# Patient Record
Sex: Female | Born: 1985 | Race: Black or African American | Hispanic: No | Marital: Married | Smoking: Never smoker
Health system: Southern US, Community
[De-identification: ages and names within clinical notes are randomized; demographics above are authoritative.]

## PROBLEM LIST (undated history)

## (undated) DIAGNOSIS — T7840XA Allergy, unspecified, initial encounter: Secondary | ICD-10-CM

## (undated) DIAGNOSIS — B977 Papillomavirus as the cause of diseases classified elsewhere: Secondary | ICD-10-CM

## (undated) DIAGNOSIS — R896 Abnormal cytological findings in specimens from other organs, systems and tissues: Secondary | ICD-10-CM

## (undated) DIAGNOSIS — IMO0002 Reserved for concepts with insufficient information to code with codable children: Secondary | ICD-10-CM

## (undated) DIAGNOSIS — F419 Anxiety disorder, unspecified: Secondary | ICD-10-CM

## (undated) DIAGNOSIS — E119 Type 2 diabetes mellitus without complications: Secondary | ICD-10-CM

## (undated) HISTORY — DX: Abnormal cytological findings in specimens from other organs, systems and tissues: R89.6

## (undated) HISTORY — DX: Allergy, unspecified, initial encounter: T78.40XA

## (undated) HISTORY — DX: Anxiety disorder, unspecified: F41.9

## (undated) HISTORY — DX: Reserved for concepts with insufficient information to code with codable children: IMO0002

## (undated) HISTORY — DX: Papillomavirus as the cause of diseases classified elsewhere: B97.7

---
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
## 2008-03-08 ENCOUNTER — Emergency Department (HOSPITAL_COMMUNITY): Admission: EM | Admit: 2008-03-08 | Discharge: 2008-03-08 | Payer: Self-pay | Admitting: Emergency Medicine

## 2008-03-28 ENCOUNTER — Ambulatory Visit (HOSPITAL_COMMUNITY): Admission: RE | Admit: 2008-03-28 | Discharge: 2008-03-28 | Payer: Self-pay | Admitting: Orthopedic Surgery

## 2008-07-13 ENCOUNTER — Encounter: Payer: Self-pay | Admitting: Gynecology

## 2008-07-13 ENCOUNTER — Ambulatory Visit: Payer: Self-pay | Admitting: Gynecology

## 2008-07-13 ENCOUNTER — Other Ambulatory Visit: Admission: RE | Admit: 2008-07-13 | Discharge: 2008-07-13 | Payer: Self-pay | Admitting: Gynecology

## 2008-09-16 ENCOUNTER — Emergency Department (HOSPITAL_COMMUNITY): Admission: EM | Admit: 2008-09-16 | Discharge: 2008-09-16 | Payer: Self-pay | Admitting: Emergency Medicine

## 2009-07-25 ENCOUNTER — Emergency Department (HOSPITAL_BASED_OUTPATIENT_CLINIC_OR_DEPARTMENT_OTHER): Admission: EM | Admit: 2009-07-25 | Discharge: 2009-07-25 | Payer: Self-pay | Admitting: Emergency Medicine

## 2009-07-25 ENCOUNTER — Ambulatory Visit: Payer: Self-pay | Admitting: Diagnostic Radiology

## 2009-09-03 DIAGNOSIS — IMO0001 Reserved for inherently not codable concepts without codable children: Secondary | ICD-10-CM

## 2009-09-03 DIAGNOSIS — B977 Papillomavirus as the cause of diseases classified elsewhere: Secondary | ICD-10-CM

## 2009-09-03 HISTORY — DX: Papillomavirus as the cause of diseases classified elsewhere: B97.7

## 2009-09-03 HISTORY — DX: Reserved for inherently not codable concepts without codable children: IMO0001

## 2009-09-13 ENCOUNTER — Other Ambulatory Visit: Admission: RE | Admit: 2009-09-13 | Discharge: 2009-09-13 | Payer: Self-pay | Admitting: Gynecology

## 2009-09-13 ENCOUNTER — Ambulatory Visit: Payer: Self-pay | Admitting: Gynecology

## 2009-10-04 DIAGNOSIS — IMO0002 Reserved for concepts with insufficient information to code with codable children: Secondary | ICD-10-CM

## 2009-10-04 HISTORY — DX: Reserved for concepts with insufficient information to code with codable children: IMO0002

## 2009-10-22 ENCOUNTER — Ambulatory Visit: Payer: Self-pay | Admitting: Gynecology

## 2010-07-05 ENCOUNTER — Emergency Department (HOSPITAL_COMMUNITY)
Admission: EM | Admit: 2010-07-05 | Discharge: 2010-07-05 | Payer: Self-pay | Source: Home / Self Care | Admitting: Emergency Medicine

## 2010-09-15 LAB — RAPID STREP SCREEN (MED CTR MEBANE ONLY): Streptococcus, Group A Screen (Direct): NEGATIVE

## 2010-09-23 ENCOUNTER — Other Ambulatory Visit (HOSPITAL_COMMUNITY)
Admission: RE | Admit: 2010-09-23 | Discharge: 2010-09-23 | Disposition: A | Discharge status: Home / Self Care | Payer: 59 | Source: Ambulatory Visit | Attending: Gynecology | Admitting: Gynecology

## 2010-09-23 ENCOUNTER — Other Ambulatory Visit: Payer: Self-pay | Admitting: Gynecology

## 2010-09-23 ENCOUNTER — Encounter (INDEPENDENT_AMBULATORY_CARE_PROVIDER_SITE_OTHER): Payer: 59 | Admitting: Gynecology

## 2010-09-23 DIAGNOSIS — Z124 Encounter for screening for malignant neoplasm of cervix: Secondary | ICD-10-CM | POA: Insufficient documentation

## 2010-09-23 DIAGNOSIS — N912 Amenorrhea, unspecified: Secondary | ICD-10-CM

## 2010-09-23 DIAGNOSIS — Z113 Encounter for screening for infections with a predominantly sexual mode of transmission: Secondary | ICD-10-CM

## 2010-09-23 DIAGNOSIS — Z01419 Encounter for gynecological examination (general) (routine) without abnormal findings: Secondary | ICD-10-CM

## 2010-09-23 DIAGNOSIS — N946 Dysmenorrhea, unspecified: Secondary | ICD-10-CM

## 2010-09-23 DIAGNOSIS — Z833 Family history of diabetes mellitus: Secondary | ICD-10-CM

## 2010-09-26 ENCOUNTER — Ambulatory Visit (INDEPENDENT_AMBULATORY_CARE_PROVIDER_SITE_OTHER): Payer: 59 | Admitting: Gynecology

## 2010-09-26 ENCOUNTER — Other Ambulatory Visit: Payer: 59

## 2010-09-26 DIAGNOSIS — N949 Unspecified condition associated with female genital organs and menstrual cycle: Secondary | ICD-10-CM

## 2010-09-26 DIAGNOSIS — N912 Amenorrhea, unspecified: Secondary | ICD-10-CM

## 2010-09-26 DIAGNOSIS — R141 Gas pain: Secondary | ICD-10-CM

## 2010-09-26 DIAGNOSIS — Z13220 Encounter for screening for lipoid disorders: Secondary | ICD-10-CM

## 2010-09-26 DIAGNOSIS — Z833 Family history of diabetes mellitus: Secondary | ICD-10-CM

## 2010-10-16 LAB — POCT I-STAT, CHEM 8
BUN: 9 mg/dL (ref 6–23)
Calcium, Ion: 1.15 mmol/L (ref 1.12–1.32)
Chloride: 105 mEq/L (ref 96–112)
Creatinine, Ser: 0.8 mg/dL (ref 0.4–1.2)
Glucose, Bld: 105 mg/dL — ABNORMAL HIGH (ref 70–99)
HCT: 42 % (ref 36.0–46.0)
Hemoglobin: 14.3 g/dL (ref 12.0–15.0)
Potassium: 3.9 mEq/L (ref 3.5–5.1)
Sodium: 139 mEq/L (ref 135–145)
TCO2: 27 mmol/L (ref 0–100)

## 2010-10-20 ENCOUNTER — Other Ambulatory Visit: Payer: 59

## 2010-11-21 NOTE — Op Note (Signed)
Yolanda Gutierrez, Yolanda Gutierrez             ACCOUNT NO.:  [id number]   MEDICAL RECORD NO.:  [id number]          PATIENT TYPE:  AMB   LOCATION:  DSC                          FACILITY:  [facility]   PHYSICIAN:  Harvie Junior, M.D.   DATE OF BIRTH:  1986-06-03   DATE OF PROCEDURE:  11/12/2004  DATE OF DISCHARGE:  11/12/2004                                 OPERATIVE REPORT   PREOPERATIVE DIAGNOSES:  Anterior cruciate ligament tear, right.   POSTOPERATIVE DIAGNOSES:  Anterior cruciate ligament tear, right.   PROCEDURE:  Anterior cruciate ligament reconstruction with __________ 1/3  patellar tendon autograft.   SURGEON:  Harvie Junior, M.D.   ASSISTANT:  Marshia Ly, P.A.   ANESTHESIA:  General.   BRIEF HISTORY:  This 25 year old female with a long history of having  twisting injury to her knee suffering an anterior cruciate ligament tear.  MRI was obtained which showed this tear with no other meniscal pathology.  She was taken to the operating room for fixation of the same after a  discussion of the treatment options and risks.   DESCRIPTION OF PROCEDURE:  The patient was taken to the operating room and  after an adequate level of anesthesia was obtained with general anesthetic,  the patient was placed on the OR table, her right leg was prepped and draped  in the usual sterile fashion. Following this, routine arthroscopic  examination of the knee revealed there was an obvious anterior cruciate  ligament tear. Preoperative exam under anesthesia showed that she had a  positive pivot shift and positive lock. At this point, the remainder of the  knee examination was performed, the medial and lateral meniscus were intact.  The chondral surfaces were intact both medial and laterally. At this point,  attention was turned to the notch where a notchplasty was performed with a  combination of motorized bur and suction shaver. Once the notchplasty had  been performed attention was turned towards  exsanguinating the knee and  tourniquet inflated to 350 mmHg. Following this, a midline incision was made  from the patellar tendon distally. The central one-third of the patellar  tendon was harvested with bone plugs proximally and distally and this was  taken to the back table where Gus Puma, fashioned this to fit in the  knee. This time attention was turned back into the knee where the Arthrex  instruments were used. A 10 mm hole was drilled into the foot print of the  old ACL 6 mm over the top. A guide was then used to find the back wall and  the femoral hole was drilled at this point. Following this, a notch was made  for the femoral screw. All excess bone fragments were milked out of the knee  at this point and the graft which had been fashioned on the back table by  Gus Puma was passed through the knee through the needle that had been  passed into the knee. The graft was then locked in place with a 7 x 20 mm  screw proximally and distally. The knee was put through a range of motion  and isometry was excellent. No tendency towards __________. At this point,  the knee was examined and was Lachman negative, pivot negative. The knee was  copiously irrigated and suctioned dry. The defect in the patellar tendon was  closed with one Ethibond interrupted. The subcu was closed  with #0 and 2-0 Vicryl and the skin with 3-0 Maxon pullout suture. Benzoin  and Steri-Strips applied as well as a sterile compressive dressing with easy  wrap. The patient was taken to the recovery room where she was noted to be  in satisfactory condition.       JLG/MEDQ  D:  12/03/2004  T:  12/03/2004  Job:  [id number]

## 2010-11-21 NOTE — Op Note (Signed)
Yolanda Gutierrez, MCEUEN             ACCOUNT NO.:  [id number]   MEDICAL RECORD NO.:  [id number]          PATIENT TYPE:  AMB   LOCATION:  DSC                          FACILITY:  [facility]   PHYSICIAN:  Harvie Junior, M.D.   DATE OF BIRTH:  05-25-1986   DATE OF PROCEDURE:  11/12/2004  DATE OF DISCHARGE:                                 OPERATIVE REPORT   PREOPERATIVE DIAGNOSIS:  Anterior cruciate ligament tear.   POSTOPERATIVE DIAGNOSIS:  Anterior cruciate ligament tear.   PRINCIPAL PROCEDURE:  Anterior cruciate ligament reconstruction with central  one-third patellar tendon.   SURGEON:  Harvie Junior, M.D.   ASSISTANT:  Marshia Ly, P.A.   ANESTHESIA:  General.   BRIEF HISTORY:  She is a 25 year old female with a long history of having a  twisting type injury to her knee.  She ultimately suffered an ACL tear which  was confirmed by MRI.  No other significant abnormality was noted in the  knee by MRI.  At this point, the patient had a long discussion about  treatment options and ultimately elected to undergo ACL reconstruction.  She  is brought to the operating room for this procedure.   PROCEDURE:  The patient was brought to the operating room.  After adequate  anesthesia was obtained  with general anesthesia, the patient was placed on  the operating table.  The right leg was prepped and draped in the usual  sterile fashion.  Following this, routine arthroscopic examination of the  right knee revealed that there was an obvious tear of the anterior cruciate  ligament with a cyclops balled-up bundle.  The medial meniscus was within  normal limits.  Medial femoral condyle within normal limits.  Lateral side  and lateral femoral condyle within normal limits.  Patellofemoral joint  within normal limits.  Attention was then turned to the old ACL, which was  debrided.  The stump was debrided, and following this the notchplasty was  performed, although a significant notchplasty was not  needed in this case.  Following this, attention was turned toward exsanguination of the extremity,  and a blood pressure __________ related to 300 mmHg.  Following, this, a  small incision was made from the inferior pole of the patella distally.  Subcutaneous tissue was taken down to the level of the paratenon, which was  carefully divided.  The central one-third of the patellar tendon was then  harvested with bone plugs proximally and distally.  At this point, the graft  was taken to the back table and fashioned by Marshia Ly, graftologist.  At this point, attention was turned toward the knee, where the  femoral/tibial guide was set in the old stump of the ACL.  A guide wire was  advanced over this area.  This was overreamed to 10 mm.  Following this, an  over-the-top guide was used to establish a point 6.5 mm anterior to the  distal cortex.  At this point, a 9 mm reamer was used over this guide, and a  femoral hole was then drilled.  Following this, a notcher was used to  establish  a place for a screw.  At this point, all the excess bony fragments  in the knee were removed, and attention was turned toward bringing the graft  into the knee.  It was locked proximally with a 7 x 20 screw.  Excellent  fixation was achieved.  The knee was then cycled through a range of motion.  No tendency toward anisometry.  At this point, the graft was locked in  place, and the leg was put through a range of motion.  Excellent range of  motion was achieved, easy full extension.  Attention was then turned both  medially and laterally at this point to see no remaining bone fragments.  At  this point, the knee was copiously irrigated and suctioned dry.  The central  one-third of the patellar tendon defect was closed with three #2 Ethibond  interrupted sutures.  Paratenon was closed with an 0 Vicryl running suture,  skin with 2-0 Vicryl and 3-0  Maxon pull-up suture.  Benzoin and Steri-Strips were applied.   Sterile  compressive dressing was applied, as well as an EzyWrap knee immobilizer and  the patient was taken to the recovery room and noted to be in satisfactory  condition.  Estimated blood loss for the procedure was none.       JLG/MEDQ  D:  11/12/2004  T:  11/12/2004  Job:  [id number]   cc:   Harvie Junior, M.D.  [address]  [place]  [place] [address]  Fax: [phone]

## 2011-01-12 ENCOUNTER — Emergency Department (HOSPITAL_COMMUNITY): Payer: 59

## 2011-01-12 ENCOUNTER — Emergency Department (HOSPITAL_COMMUNITY)
Admission: EM | Admit: 2011-01-12 | Discharge: 2011-01-12 | Disposition: A | Discharge status: Home / Self Care | Payer: 59 | Attending: Emergency Medicine | Admitting: Emergency Medicine

## 2011-01-12 DIAGNOSIS — H5710 Ocular pain, unspecified eye: Secondary | ICD-10-CM | POA: Insufficient documentation

## 2011-01-12 DIAGNOSIS — B029 Zoster without complications: Secondary | ICD-10-CM | POA: Insufficient documentation

## 2011-01-12 DIAGNOSIS — R51 Headache: Secondary | ICD-10-CM | POA: Insufficient documentation

## 2011-01-12 LAB — DIFFERENTIAL
Basophils Absolute: 0 10*3/uL (ref 0.0–0.1)
Basophils Relative: 0 % (ref 0–1)
Eosinophils Absolute: 0.3 10*3/uL (ref 0.0–0.7)
Eosinophils Relative: 2 % (ref 0–5)
Lymphocytes Relative: 26 % (ref 12–46)
Lymphs Abs: 3.4 10*3/uL (ref 0.7–4.0)
Monocytes Relative: 8 % (ref 3–12)
Neutro Abs: 8.8 10*3/uL — ABNORMAL HIGH (ref 1.7–7.7)

## 2011-01-12 LAB — POCT I-STAT, CHEM 8
Calcium, Ion: 1.11 mmol/L — ABNORMAL LOW (ref 1.12–1.32)
Chloride: 102 mEq/L (ref 96–112)
Glucose, Bld: 108 mg/dL — ABNORMAL HIGH (ref 70–99)
HCT: 40 % (ref 36.0–46.0)
Hemoglobin: 13.6 g/dL (ref 12.0–15.0)

## 2011-01-12 LAB — CBC
HCT: 38.5 % (ref 36.0–46.0)
Hemoglobin: 12.9 g/dL (ref 12.0–15.0)
MCH: 28.5 pg (ref 26.0–34.0)
MCHC: 33.5 g/dL (ref 30.0–36.0)
MCV: 85.2 fL (ref 78.0–100.0)
Platelets: 351 10*3/uL (ref 150–400)
RBC: 4.52 MIL/uL (ref 3.87–5.11)
RDW: 12.3 % (ref 11.5–15.5)
WBC: 13.5 10*3/uL — ABNORMAL HIGH (ref 4.0–10.5)

## 2011-01-12 MED ORDER — IOHEXOL 300 MG/ML  SOLN
100.0000 mL | Freq: Once | INTRAMUSCULAR | Status: AC | PRN
  Administered 2011-01-12: 100 mL via INTRAVENOUS

## 2011-01-13 ENCOUNTER — Emergency Department (HOSPITAL_COMMUNITY)
Admission: EM | Admit: 2011-01-13 | Discharge: 2011-01-14 | Disposition: A | Discharge status: Home / Self Care | Payer: 59 | Attending: Emergency Medicine | Admitting: Emergency Medicine

## 2011-01-13 DIAGNOSIS — R197 Diarrhea, unspecified: Secondary | ICD-10-CM | POA: Insufficient documentation

## 2011-01-13 DIAGNOSIS — R109 Unspecified abdominal pain: Secondary | ICD-10-CM | POA: Insufficient documentation

## 2011-01-13 DIAGNOSIS — R112 Nausea with vomiting, unspecified: Secondary | ICD-10-CM | POA: Insufficient documentation

## 2011-01-13 DIAGNOSIS — R51 Headache: Secondary | ICD-10-CM | POA: Insufficient documentation

## 2011-01-14 LAB — COMPREHENSIVE METABOLIC PANEL
ALT: 18 U/L (ref 0–35)
AST: 18 U/L (ref 0–37)
Albumin: 4.1 g/dL (ref 3.5–5.2)
Alkaline Phosphatase: 94 U/L (ref 39–117)
BUN: 9 mg/dL (ref 6–23)
CO2: 25 mEq/L (ref 19–32)
Calcium: 9.8 mg/dL (ref 8.4–10.5)
Chloride: 100 mEq/L (ref 96–112)
GFR calc Af Amer: >60 mL/min (ref >60)
GFR calc non Af Amer: >60 mL/min (ref >60)
Glucose, Bld: 154 mg/dL — ABNORMAL HIGH (ref 70–99)
Potassium: 3.9 mEq/L (ref 3.5–5.1)
Sodium: 135 mEq/L (ref 135–145)
Total Bilirubin: 0.2 mg/dL — ABNORMAL LOW (ref 0.3–1.2)
Total Protein: 8 g/dL (ref 6.0–8.3)

## 2011-01-14 LAB — CBC
HCT: 40.5 % (ref 36.0–46.0)
Hemoglobin: 13.6 g/dL (ref 12.0–15.0)
MCH: 28.8 pg (ref 26.0–34.0)
MCV: 85.8 fL (ref 78.0–100.0)
Platelets: 367 10*3/uL (ref 150–400)
RDW: 12.3 % (ref 11.5–15.5)
WBC: 13.7 10*3/uL — ABNORMAL HIGH (ref 4.0–10.5)

## 2011-01-14 LAB — DIFFERENTIAL
Basophils Absolute: 0 10*3/uL (ref 0.0–0.1)
Basophils Relative: 0 % (ref 0–1)
Eosinophils Absolute: 0.1 10*3/uL (ref 0.0–0.7)
Eosinophils Relative: 1 % (ref 0–5)
Lymphocytes Relative: 21 % (ref 12–46)
Lymphs Abs: 2.8 10*3/uL (ref 0.7–4.0)
Monocytes Absolute: 0.7 10*3/uL (ref 0.1–1.0)
Neutro Abs: 10 10*3/uL — ABNORMAL HIGH (ref 1.7–7.7)
Neutrophils Relative %: 73 % (ref 43–77)

## 2011-01-19 ENCOUNTER — Ambulatory Visit (INDEPENDENT_AMBULATORY_CARE_PROVIDER_SITE_OTHER): Payer: 59 | Admitting: Gynecology

## 2011-01-19 DIAGNOSIS — Z833 Family history of diabetes mellitus: Secondary | ICD-10-CM

## 2011-01-19 DIAGNOSIS — Z113 Encounter for screening for infections with a predominantly sexual mode of transmission: Secondary | ICD-10-CM

## 2011-01-19 DIAGNOSIS — N926 Irregular menstruation, unspecified: Secondary | ICD-10-CM

## 2011-01-19 DIAGNOSIS — N898 Other specified noninflammatory disorders of vagina: Secondary | ICD-10-CM

## 2011-01-19 DIAGNOSIS — B3731 Acute candidiasis of vulva and vagina: Secondary | ICD-10-CM

## 2011-01-19 DIAGNOSIS — R1031 Right lower quadrant pain: Secondary | ICD-10-CM

## 2011-01-19 DIAGNOSIS — R1032 Left lower quadrant pain: Secondary | ICD-10-CM

## 2011-01-19 DIAGNOSIS — B373 Candidiasis of vulva and vagina: Secondary | ICD-10-CM

## 2011-01-26 ENCOUNTER — Telehealth: Payer: Self-pay | Admitting: *Deleted

## 2011-01-26 NOTE — Telephone Encounter (Signed)
PT CALLED Friday 01/23/11 RE:LAB RESULT,CALLED PT BACK & LM.

## 2011-01-26 NOTE — Telephone Encounter (Signed)
PT INFORMED WITH THE LAST NOTE RE:GI APPT. APPT. SET FOR PT ON 01/29/11 AT 10:15AM WITH DR. Randa Evens. RECENT LAB AND NOTES FAXED TO [facility] [phone]. PER REQUEST.

## 2011-01-26 NOTE — Telephone Encounter (Signed)
PT CALLED Friday 01/23/11 RE:LAB RESULTS. PT CALLED BACK LAB RESULTS GIVEN. PT CALLED TODAY WITH SAME ISSUES AS NOTE ON 01/19/11(SEE CHART) PLEASE ADVISE. STOMACH PAIN & PELVIC PAIN ONLY. JW

## 2011-01-26 NOTE — Telephone Encounter (Signed)
Based on my prior exam I feel that she has a GI component to her pain and I think we need to have her see a gastroenterologist. Set up an appointment with a [facility] or [facility] whoever can see her the soonest and will let them take a look at her.

## 2011-01-30 ENCOUNTER — Telehealth: Payer: Self-pay | Admitting: *Deleted

## 2011-01-30 NOTE — Telephone Encounter (Signed)
PT CALLED STATING HER PERIOD HAD NO CAME YET. TAKING PROVERA 10 MG BID X 5DAY. PT INSTRUCTED TO WAIT 14 DAYS IF NO PERIOD YET THEN CALL OFFICE AGAIN.

## 2011-03-06 ENCOUNTER — Encounter: Payer: Self-pay | Admitting: *Deleted

## 2011-03-06 ENCOUNTER — Encounter: Payer: Self-pay | Admitting: Gynecology

## 2011-03-06 ENCOUNTER — Ambulatory Visit (INDEPENDENT_AMBULATORY_CARE_PROVIDER_SITE_OTHER): Payer: 59 | Admitting: Gynecology

## 2011-03-06 DIAGNOSIS — B977 Papillomavirus as the cause of diseases classified elsewhere: Secondary | ICD-10-CM | POA: Insufficient documentation

## 2011-03-06 DIAGNOSIS — N926 Irregular menstruation, unspecified: Secondary | ICD-10-CM

## 2011-03-06 DIAGNOSIS — IMO0001 Reserved for inherently not codable concepts without codable children: Secondary | ICD-10-CM | POA: Insufficient documentation

## 2011-03-06 DIAGNOSIS — N644 Mastodynia: Secondary | ICD-10-CM

## 2011-03-06 LAB — POCT URINE PREGNANCY: Preg Test, Ur: NEGATIVE

## 2011-03-06 NOTE — Progress Notes (Signed)
Patient presents complaining of tender swollen breasts bilaterally. Patient notes of the last 2 weeks her breasts have progressively gotten more tender. She just started on Sprintec oral contraceptives this past month after progesterone withdrawal. She does admit to not taking the pills regularly initially had some BRCA2 bleeding over the months. She has no nipple discharge fevers chills or other symptoms.  Exam Breasts: Both breasts examined lying and sitting and without masses retractions discharge adenopathy.  Assessment and plan: #1 Mastodynia. I suspect this is related to the pill she is just going to her menses now and is to restart a pack Sunday. Options for management to include continuing on the pack overtaken more consistently we'll see how she does versus switching to a different pill such as a Lo Estrin 120 equivalent. My preference would be for her to stay on the same pill and then we'll see how she does. I did order a prolactin and a UPT for completeness. Patient agrees with the plan. She will let me know how she's doing in a month after she's into the second pac.

## 2012-03-03 ENCOUNTER — Ambulatory Visit (INDEPENDENT_AMBULATORY_CARE_PROVIDER_SITE_OTHER): Payer: 59 | Admitting: Gynecology

## 2012-03-03 ENCOUNTER — Encounter: Payer: Self-pay | Admitting: Gynecology

## 2012-03-03 DIAGNOSIS — R87612 Low grade squamous intraepithelial lesion on cytologic smear of cervix (LGSIL): Secondary | ICD-10-CM

## 2012-03-03 DIAGNOSIS — E282 Polycystic ovarian syndrome: Secondary | ICD-10-CM

## 2012-03-03 DIAGNOSIS — N938 Other specified abnormal uterine and vaginal bleeding: Secondary | ICD-10-CM

## 2012-03-03 DIAGNOSIS — N949 Unspecified condition associated with female genital organs and menstrual cycle: Secondary | ICD-10-CM

## 2012-03-03 DIAGNOSIS — N912 Amenorrhea, unspecified: Secondary | ICD-10-CM

## 2012-03-03 DIAGNOSIS — IMO0002 Reserved for concepts with insufficient information to code with codable children: Secondary | ICD-10-CM

## 2012-03-03 LAB — PREGNANCY, URINE: Preg Test, Ur: NEGATIVE

## 2012-03-03 MED ORDER — MEDROXYPROGESTERONE ACETATE 10 MG PO TABS: 10.0000 mg | ORAL_TABLET | Freq: Every day | ORAL | Status: DC

## 2012-03-03 NOTE — Patient Instructions (Signed)
Take provera pill daily for 10 days.  Follow up in one month for annual exam.

## 2012-03-03 NOTE — Progress Notes (Signed)
Patient presents complaining of brown spotting over the past month or so.  History of oligomenorrhea evaluation last year showed normal FSH TSH prolactin. Was withdrawn on Provera and started on oral contraceptives. Had regular menses through February when she stopped her oral contraceptives in anticipation of marriage and pregnancy desire and has not had a period since then. No cramping pain or other symptoms. No recent weight gain skin changes hair changes. Also had ultrasound last year which showed endometrial echo 6.2 mm normal uterus and ovaries with multiple small follicles suggestive of a PCO pattern. She is wanting to become pregnant sometime over the next several months.  Past medical history,surgical history, medications, allergies, family history and social history were all reviewed and documented in the EPIC chart. ROS:  Was performed and pertinent positives and negatives are included in the history.  Exam of Yolanda Gutierrez Asst. Abdomen soft nontender without masses guarding rebound organomegaly. Pelvic external BUS vagina normal with brown staining. Cervix normal with brown mucoid staining. Uterus grossly normal midline mobile nontender. Adnexa without masses or tenderness.  Assessment and plan: 1. Amenorrhea with most recent DUB. History, labs, ultrasound consistent with PCO pattern. Patient taking a pregnancy sometime over the next several months. UPT negative today. We'll withdraw with Provera 10 mg daily x10 days. Patient's overdue for her annual exam I will schedule this in a month and we'll reassess her withdrawal pattern at that time.  Will plan intermittent progesterone withdrawals every 6 weeks if without spontaneous menses. Does not want to start oral contraceptives as they planned pregnancy sometime in the next several months. 2. Pregnancy.  Start multivitamin with folic acid. Discussed oligoovulatory pattern and options.  Recommended trial of Clomid when they're ready to pursue pregnancy.  I reviewed how Clomid works, side effect profile and the risks to include multiple gestation, hyperstimulation syndrome, possible ovarian cancer linkage. We'll further discuss at her annual exam but would tentatively plan on progesterone withdrawal as needed and initiation of Clomid when they're ready to proceed. 3. 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 Colposcopic biopsy showed low-grade SIL. Follow up Pap 2012 was negative. We'll do Pap smear now when she comes for her annual. As long as normal or low-grade them we'll plan expectant management.

## 2012-03-06 ENCOUNTER — Encounter (HOSPITAL_BASED_OUTPATIENT_CLINIC_OR_DEPARTMENT_OTHER): Payer: Self-pay | Admitting: *Deleted

## 2012-03-06 ENCOUNTER — Emergency Department (HOSPITAL_BASED_OUTPATIENT_CLINIC_OR_DEPARTMENT_OTHER)
Admission: EM | Admit: 2012-03-06 | Discharge: 2012-03-06 | Disposition: A | Discharge status: Home / Self Care | Payer: 59 | Attending: Emergency Medicine | Admitting: Emergency Medicine

## 2012-03-06 DIAGNOSIS — K089 Disorder of teeth and supporting structures, unspecified: Secondary | ICD-10-CM | POA: Insufficient documentation

## 2012-03-06 DIAGNOSIS — K0889 Other specified disorders of teeth and supporting structures: Secondary | ICD-10-CM

## 2012-03-06 MED ORDER — OXYCODONE-ACETAMINOPHEN 5-325 MG PO TABS: 1.0000 | ORAL_TABLET | ORAL | Status: AC | PRN

## 2012-03-06 MED ORDER — PENICILLIN V POTASSIUM 500 MG PO TABS: 500.0000 mg | ORAL_TABLET | Freq: Three times a day (TID) | ORAL | Status: AC

## 2012-03-06 MED ORDER — IBUPROFEN 800 MG PO TABS
800.0000 mg | ORAL_TABLET | Freq: Once | ORAL | Status: AC
  Administered 2012-03-06: 800 mg via ORAL
  Filled 2012-03-06: qty 1

## 2012-03-06 MED ORDER — OXYCODONE-ACETAMINOPHEN 5-325 MG PO TABS
2.0000 | ORAL_TABLET | Freq: Once | ORAL | Status: AC
Start: 2012-03-06 — End: 2012-03-06
  Administered 2012-03-06: 2 via ORAL
  Filled 2012-03-06 (×2): qty 2

## 2012-03-06 MED ORDER — PENICILLIN V POTASSIUM 250 MG PO TABS
500.0000 mg | ORAL_TABLET | Freq: Once | ORAL | Status: AC
  Administered 2012-03-06: 500 mg via ORAL
  Filled 2012-03-06: qty 2

## 2012-03-06 NOTE — ED Notes (Signed)
Dental pain x 3 days.  

## 2012-03-06 NOTE — ED Provider Notes (Signed)
History  This chart was scribed for Hilario Quarry, MD by Erskine Emery. This patient was seen in room MH11/MH11 and the patient's care was started at 18:02.   CSN: [id number]  Arrival date & time 03/06/12  1545   First MD Initiated Contact with Patient 03/06/12 1802      Chief Complaint  Patient presents with  . Dental Pain    (Consider location/radiation/quality/duration/timing/severity/associated sxs/prior treatment) The history is provided by the patient. No language interpreter was used.  Yolanda Gutierrez is a 26 y.o. female who presents to the Emergency Department complaining of gradually worsening moderate dental pain and swelling on the right side for the past 3 days. Pt thinks the pain is associated with her right wisdom tooth. She took amoxycilin and pain medication PTA with no relief from symptoms. Pt reports a headache but denies any fever or ear pain. Pt has no other health problems, is on no medications, and has NKDA other than acyclovir.  Past Medical History  Diagnosis Date  . ASCUS (atypical squamous cells of undetermined significance) on Pap smear 09/2009  . 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 Anterior cruciate ligament repair 2007    Family History  Problem Relation Age of Onset  . Diabetes Father   . Hypertension Father   . Colon cancer Maternal Grandfather     History  Substance Use Topics  . Smoking status: Never Smoker   . Smokeless tobacco: Never Used  . Alcohol Use: Yes    OB History    Grav Para Term Preterm Abortions TAB SAB Ect Mult Living   0               Review of Systems  Constitutional: Negative for fever and chills.  HENT: Positive for dental problem. Negative for hearing loss and ear discharge.   Respiratory: Negative for shortness of breath.   Gastrointestinal: Negative for nausea and vomiting.  Neurological: Positive for  headaches. Negative for weakness.  All other systems reviewed and are negative.    Allergies  Acyclovir and related; Nickel; and Tomato  Home Medications   Current Outpatient Rx  Name Route Sig Dispense Refill  . ACETAMINOPHEN-CODEINE #3 300-30 MG PO TABS Oral Take 1 tablet by mouth every 4 (four) hours as needed. For pain.    Marland Kitchen AMOXICILLIN 500 MG PO CAPS Oral Take 500 mg by mouth 3 (three) times daily.    Marland Kitchen MEDROXYPROGESTERONE ACETATE 10 MG PO TABS Oral Take 1 tablet (10 mg total) by mouth daily. 10 tablet 0    BP 114/72  Pulse 75  Temp 98.7 F (37.1 C) (Oral)  Resp 14  Ht 5\' 8"  (1.727 m)  Wt 220 lb (99.791 kg)  BMI 33.45 kg/m2  SpO2 99%  Physical Exam  Nursing note and vitals reviewed. Constitutional: She is oriented to person, place, and time. She appears well-developed and well-nourished. No distress.  HENT:  Head: Normocephalic and atraumatic.       Mouth is all tender and swollen. No focal swelling.  Floor of mouth is not swollen. Back of throat is clear.  Eyes: EOM are normal. Pupils are equal, round, and reactive to light.  Neck: Neck supple. No tracheal deviation present.  Cardiovascular: Normal rate.   Pulmonary/Chest: Effort normal. No respiratory distress.  Abdominal: Soft. She exhibits no distension.  Musculoskeletal: Normal range of motion. She exhibits no edema.  Neurological: She is alert and oriented to person, place, and time.  Skin: Skin is warm and dry.  Psychiatric: She has a normal mood and affect.    ED Course  Procedures (including critical care time) DIAGNOSTIC STUDIES: Oxygen Saturation is 99% on room air, normal by my interpretation.    COORDINATION OF CARE: 18:10--I evaluated the patient and we discussed a treatment plan including pain medication to which the pt agreed. I instructed the pt to follow up with a dentist.    Labs Reviewed - No data to display No results found.   No diagnosis found.    I personally performed the  services described in this documentation, which was scribed in my presence. The recorded information has been reviewed and considered.    Hilario Quarry, MD 03/06/12 [phone]

## 2012-03-26 ENCOUNTER — Emergency Department (HOSPITAL_BASED_OUTPATIENT_CLINIC_OR_DEPARTMENT_OTHER): Payer: 59

## 2012-03-26 ENCOUNTER — Emergency Department (HOSPITAL_BASED_OUTPATIENT_CLINIC_OR_DEPARTMENT_OTHER)
Admission: EM | Admit: 2012-03-26 | Discharge: 2012-03-27 | Disposition: A | Discharge status: Home / Self Care | Payer: 59 | Attending: Emergency Medicine | Admitting: Emergency Medicine

## 2012-03-26 ENCOUNTER — Encounter (HOSPITAL_BASED_OUTPATIENT_CLINIC_OR_DEPARTMENT_OTHER): Payer: Self-pay | Admitting: Emergency Medicine

## 2012-03-26 DIAGNOSIS — M79672 Pain in left foot: Secondary | ICD-10-CM

## 2012-03-26 DIAGNOSIS — M79609 Pain in unspecified limb: Secondary | ICD-10-CM | POA: Insufficient documentation

## 2012-03-26 NOTE — ED Notes (Signed)
Transported to xray 

## 2012-03-26 NOTE — ED Notes (Signed)
Pt sts she began having left foot pain a few days ago, having difficulty walking.

## 2012-03-27 MED ORDER — HYDROCODONE-ACETAMINOPHEN 5-325 MG PO TABS: 1.0000 | ORAL_TABLET | Freq: Four times a day (QID) | ORAL | Status: AC | PRN

## 2012-03-27 MED ORDER — HYDROCODONE-ACETAMINOPHEN 5-325 MG PO TABS
1.0000 | ORAL_TABLET | Freq: Once | ORAL | Status: DC
  Filled 2012-03-27: qty 1

## 2012-03-27 MED ORDER — NAPROXEN SODIUM 220 MG PO TABS: ORAL_TABLET | ORAL | Status: DC

## 2012-03-27 MED ORDER — NAPROXEN 250 MG PO TABS
500.0000 mg | ORAL_TABLET | Freq: Once | ORAL | Status: AC
  Administered 2012-03-27: 500 mg via ORAL
  Filled 2012-03-27 (×2): qty 1

## 2012-03-27 NOTE — ED Provider Notes (Signed)
History     CSN: [id number]  Arrival date & time 03/26/12  2321   First MD Initiated Contact with Patient 03/27/12 0038      Chief Complaint  Patient presents with  . Foot Pain    (Consider location/radiation/quality/duration/timing/severity/associated sxs/prior treatment) HPI Referral black female with several days of pain in her left foot. The pain is located in the mid foot, proximal to the metatarsals. She describes the pain as a deep pain. It is moderate to severe in intensity. It is worse with palpation, movement or ambulation. She has not been using any new shoes recently. She denies acute trauma. She states it is swollen. There is no left ankle pain.  Past Medical History  Diagnosis Date  . ASCUS (atypical squamous cells of undetermined significance) on Pap smear 09/2009  . 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 Anterior cruciate ligament repair 2007    Family History  Problem Relation Age of Onset  . Diabetes Father   . Hypertension Father   . Colon cancer Maternal Grandfather     History  Substance Use Topics  . Smoking status: Never Smoker   . Smokeless tobacco: Never Used  . Alcohol Use: 0.6 oz/week    1 Cans of beer per week    OB History    Grav Para Term Preterm Abortions TAB SAB Ect Mult Living   0               Review of Systems  All other systems reviewed and are negative.    Allergies  Acyclovir and related; Nickel; and Tomato  Home Medications   Current Outpatient Rx  Name Route Sig Dispense Refill  . MEDROXYPROGESTERONE ACETATE 10 MG PO TABS Oral Take 1 tablet (10 mg total) by mouth daily. 10 tablet 0  . ACETAMINOPHEN-CODEINE #3 300-30 MG PO TABS Oral Take 1 tablet by mouth every 4 (four) hours as needed. For pain.    Marland Kitchen AMOXICILLIN 500 MG PO CAPS Oral Take 500 mg by mouth 3 (three) times daily.      LMP 03/12/2012  Physical  Exam General: Well-developed, well-nourished female in no acute distress; appearance consistent with age of record HENT: normocephalic, atraumatic Eyes: Normal appearance Neck: supple Heart: regular rate and rhythm Lungs: Respiratory effort and excursion Abdomen: soft; nondistended Extremities: No deformity; tender over proximal left foot with slight swelling, no erythema or warmth Neurologic: Awake, alert and oriented; motor function intact in all extremities and symmetric; no facial droop Skin: Warm and dry Psychiatric: Normal mood and affect    ED Course  Procedures (including critical care time)    MDM   Nursing notes and vitals signs, including pulse oximetry, reviewed.  Summary of this visit's results, reviewed by myself:   Imaging Studies: Dg Foot Complete Left  03/27/2012  *RADIOLOGY REPORT*  Clinical Data: Foot pain  LEFT FOOT - COMPLETE 3+ VIEW  Comparison: None.  Findings: No fracture or dislocation is seen.  The joint spaces are preserved.  The visualized soft tissues are unremarkable.  IMPRESSION: Normal foot radiographs.   Original Report Authenticated By: Charline Bills, M.D.    Advised patient to wear comfortable shoes with good arch support. Arch supports may be purchased at any drug store or medical supply store. We will treat with anti-inflammatories and refer to Dr. Pearletha Forge should she not improve.  Hanley Seamen, MD 03/27/12 [phone]

## 2012-03-27 NOTE — ED Notes (Signed)
MD @ BS

## 2012-03-27 NOTE — ED Notes (Signed)
Returned from xray

## 2012-03-27 NOTE — ED Notes (Signed)
Pt refused to take the Vicodin. States she does not like to take narcotics.  MD aware. No further orders received.

## 2012-03-28 ENCOUNTER — Ambulatory Visit (INDEPENDENT_AMBULATORY_CARE_PROVIDER_SITE_OTHER): Payer: 59 | Admitting: Family Medicine

## 2012-03-28 ENCOUNTER — Encounter: Payer: Self-pay | Admitting: Family Medicine

## 2012-03-28 VITALS — BP 104/70 | HR 80 | Ht 68.0 in | Wt 220.0 lb

## 2012-03-28 DIAGNOSIS — M79609 Pain in unspecified limb: Secondary | ICD-10-CM

## 2012-03-28 DIAGNOSIS — M79672 Pain in left foot: Secondary | ICD-10-CM

## 2012-03-28 NOTE — Patient Instructions (Addendum)
Your left foot pain and swelling is either due to a stress fracture or acute gout flare. The location of your pain would be consistent with a metatarsal stress fracture that heals well with conservative care. Both of these conditions are treated similarly so imaging is not necessary unless not improving as expected. Wear postop shoe or boot when up and walking around for support. Crutches as needed. Can take shoe/boot off to ice, bathe, when sleeping. Elevate above the level of your heart when possible. Ice area 15 minutes at a time as needed. ACE wrap for compression. Naproxen twice a day (over the counter aleve would be 2 tablets twice a day with food). Pain medication as needed in addition to this. If not improving over next couple weeks call me otherwise follow-up in 1 month.

## 2012-03-28 NOTE — Assessment & Plan Note (Signed)
no known injury and does not work out regularly to suggest stress fracture though has focal 2nd and 3rd metatarsal tenderness.  Has FH gout and this started insidiously so could represent first gouty attack.  Both treated similarly initially and 2nd or 3rd MT stress fractures tend to heal well with conservative care.  Cam walker with ace wrap.  Elevation, icing.  Naproxen twice a day with food for pain, inflammation, swelling.  F/u in 1 month but call in 2 weeks if not improving.  We discussed MRI though likely would not change our initial management - she would like to wait and do conservative care first.

## 2012-03-28 NOTE — Progress Notes (Signed)
Subjective:    Patient ID: Yolanda Gutierrez, female    DOB: 1985-07-12, 26 y.o.   MRN: [id number]  PCP: None listed  HPI Yolanda Gutierrez here for left foot pain.  Patient denies known injury. 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 States while at work Wednesday started to develop left dorsal foot pain. Progressed since that time, including swelling and difficulty bearing weight. Went to ED and had x-rays negative for fracture or other abnormalities. Has been icing, elevation, taking naproxen. No h/o gout or autoimmune disease. Has h/o right foot stress fracture when she was active in sports. Does not run or exercise regularly now.  Past Medical History  Diagnosis Date  . ASCUS (atypical squamous cells of undetermined significance) on Pap smear 09/2009  . 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 LGSIL (low grade squamous intraepithelial dysplasia) 10/2009    Current Outpatient Prescriptions on File Prior to Visit  Medication Sig Dispense Refill  . HYDROcodone-acetaminophen (NORCO/VICODIN) 5-325 MG per tablet Take 1-2 tablets by mouth every 6 (six) hours as needed for pain.  20 tablet  0  . medroxyPROGESTERone (PROVERA) 10 MG tablet Take 1 tablet (10 mg total) by mouth daily.  10 tablet  0  . naproxen sodium (ALEVE) 220 MG tablet Take 2 tablets twice daily as needed for foot pain and swelling.        Past Surgical History  Procedure Date  . Anterior cruciate ligament repair 2007    Allergies  Allergen Reactions  . Acyclovir And Related Nausea And Vomiting  . Nickel   . Tomato     History   Social History  . Marital Status: Single    Spouse Name: N/A    Number of Children: N/A  . Years of Education: N/A   Occupational History  . Not on file.   Social History Main Topics  . Smoking status: Never Smoker   . Smokeless tobacco: Never Used  . Alcohol Use: 0.6 oz/week    1 Cans of beer per week  . Drug Use: No  . Sexually Active: Yes -- Female partner(s)   Other Topics Concern  .  Not on file   Social History Narrative  . No narrative on file    Family History  Problem Relation Age of Onset  . Diabetes Father   . Hypertension Father   . Hyperlipidemia Father   . Colon cancer Maternal Grandfather   . Heart attack Neg Hx   . Sudden death Neg Hx     BP 104/70  Pulse 80  Ht 5\' 8"  (1.727 m)  Wt 220 lb (99.791 kg)  BMI 33.45 kg/m2  LMP 03/12/2012  Review of Systems See HPI above.    Objective:   Physical Exam Gen: NAD  L foot: Mild swelling dorsally.  No bruising, other deformity.  Pes planus. TTP 2nd, 3rd metatarsal mainly - less TTP proximal to this, post to medial and lateral malleoli.  No other TTP about foot/ankle. Mild limitation all motions but 5/5 strength resisted motions without reproduction of pain. Negative thompsons. Negative ant drawer and talar tilt. NVI distally.    Assessment & Plan:  1. Left foot pain - no known injury and does not work out regularly to suggest stress fracture though has focal 2nd and 3rd metatarsal tenderness.  Has FH gout and this started insidiously so could represent first gouty attack.  Both treated similarly initially and 2nd or 3rd MT stress fractures tend to heal well with conservative  care.  Cam walker with ace wrap.  Elevation, icing.  Naproxen twice a day with food for pain, inflammation, swelling.  Gutierrez/u in 1 month but call in 2 weeks if not improving.  We discussed MRI though likely would not change our initial management - she would like to wait and do conservative care first.

## 2012-04-04 ENCOUNTER — Encounter: Payer: 59 | Admitting: Gynecology

## 2012-04-26 ENCOUNTER — Ambulatory Visit (INDEPENDENT_AMBULATORY_CARE_PROVIDER_SITE_OTHER): Payer: 59 | Admitting: Gynecology

## 2012-04-26 ENCOUNTER — Other Ambulatory Visit (HOSPITAL_COMMUNITY)
Admission: RE | Admit: 2012-04-26 | Discharge: 2012-04-26 | Disposition: A | Discharge status: Home / Self Care | Payer: 59 | Source: Ambulatory Visit | Attending: Gynecology | Admitting: Gynecology

## 2012-04-26 ENCOUNTER — Encounter: Payer: Self-pay | Admitting: Gynecology

## 2012-04-26 VITALS — BP 138/72 | Ht 68.0 in | Wt 218.0 lb

## 2012-04-26 DIAGNOSIS — N926 Irregular menstruation, unspecified: Secondary | ICD-10-CM

## 2012-04-26 DIAGNOSIS — Z01419 Encounter for gynecological examination (general) (routine) without abnormal findings: Secondary | ICD-10-CM

## 2012-04-26 MED ORDER — MEDROXYPROGESTERONE ACETATE 10 MG PO TABS: 10.0000 mg | ORAL_TABLET | Freq: Two times a day (BID) | ORAL | Status: DC

## 2012-04-26 NOTE — Patient Instructions (Signed)
Take Provera twice daily for 5 days to bring on. After checking a urine pregnancy test. Call for Clomid prescription.

## 2012-04-26 NOTE — Progress Notes (Signed)
Colby Garant Sep 07, 1985 [id number]        26 y.o.  G0P0 for annual exam.  Several issues noted below.  Past medical history,surgical history, medications, allergies, family history and social history were all reviewed and documented in the EPIC chart. ROS:  Was performed and pertinent positives and negatives are included in the history.  Exam: Fleet Contras assistant Filed Vitals:   04/26/12 1221  BP: 138/72  Height: 5\' 8"  (1.727 m)  Weight: 218 lb (98.884 kg)   General appearance  Normal Skin grossly normal Head/Neck normal with no cervical or supraclavicular adenopathy thyroid normal Lungs  clear Cardiac RR, without RMG Abdominal  soft, nontender, without masses, organomegaly or hernia Breasts  examined lying and sitting without masses, retractions, discharge or axillary adenopathy. Pelvic  Ext/BUS/vagina  normal   Cervix  normal Pap done  Uterus  anteverted, normal size, shape and contour, midline and mobile nontender   Adnexa  Without masses or tenderness    Anus and perineum  normal   Rectovaginal  normal sphincter tone without palpated masses or tenderness.    Assessment/Plan:  26 y.o. G0P0 female for annual exam.   1. History low-grade SIL. Patient had Pap smear showing ASCUS/high-risk HPV 2011. Colposcopic biopsy showed low-grade SIL. Pap smear 2012 normal. Pap done today. If negative plan expected management. Otherwise we'll triage based on results. 2. Oligo-ovulatory pattern.  Took Provera last month with withdrawal bleed. Awaiting menses now with negative UPT yesterday. History of PCO-type pattern. Had negative TSH prolactin FSH a year ago and ultrasound showed ring of pearl type changes. We discussed possible Clomid in August the patient is ready to proceed with this now. I again discussed with involved with Clomid and the risks to include hyperstimulation, multiple gestation and possible ovarian cancer linkage. She understands and accepts these. She'll wait one week and if  without menses withdraw on Provera 10 mg twice a day x5 days. She noticed check a UPT before starting. She will then call me and I'll prescribe Clomid 50 mg days 5 through 9. Follow up instructions were reviewed with her at the end of the cycle and timing as far as intercourse was also discussed.  Need to be on a prenatal vitamin reviewed. 3. Breast health. SBE monthly reviewed. 4. Health maintenance. The blood work done today as she had normal blood work over a year ago and this will all be studied once she achieves pregnancy.    Dara Lords MD, 12:51 PM 04/26/2012

## 2012-05-09 ENCOUNTER — Telehealth: Payer: Self-pay | Admitting: *Deleted

## 2012-05-09 ENCOUNTER — Encounter: Payer: Self-pay | Admitting: Gynecology

## 2012-05-09 NOTE — Telephone Encounter (Signed)
Pt was given provera 10 mg twice daily x 5 days as directed at OV 04/26/12, took UPT negative results. Pt called today stating no cycle yet. Pt was told to follow up if no cycle. Please advise

## 2012-05-09 NOTE — Telephone Encounter (Signed)
Wait one week. If without menses check pregnancy test again. Assuming negative call and we'll withdraw on Provera again with a little stronger dose.  Assuming she does start her period then she'll call and we'll start her on the Clomid as we discussed.

## 2012-05-09 NOTE — Telephone Encounter (Signed)
Pt informed with the below note,she will do as directed below.

## 2012-05-12 ENCOUNTER — Telehealth: Payer: Self-pay | Admitting: *Deleted

## 2012-05-12 MED ORDER — CLOMIPHENE CITRATE 50 MG PO TABS: 50.0000 mg | ORAL_TABLET | Freq: Every day | ORAL | Status: DC

## 2012-05-12 NOTE — Telephone Encounter (Signed)
Pt called and her cycle did start today. Pt said to call when cycle starts to get clomid Rx.

## 2012-05-12 NOTE — Telephone Encounter (Signed)
Clomid 50 mg daily 5 through 9. Patient to call at the end of the cycle if menses start call within the first day or 2. If late for menses wait until day 30-32 and then check a urine pregnancy test.

## 2012-05-13 NOTE — Telephone Encounter (Signed)
Spoke with pt regarding the below this am, and her cycle stopped late last night, no bleeding this am as well. Should pt still take clomid?

## 2012-05-13 NOTE — Telephone Encounter (Signed)
Pt informed with the below note. 

## 2012-05-13 NOTE — Telephone Encounter (Addendum)
Just make sure she checks a pregnancy test. Assuming negative, start Clomid day 5

## 2012-06-13 ENCOUNTER — Encounter: Payer: Self-pay | Admitting: Gynecology

## 2012-06-13 ENCOUNTER — Ambulatory Visit (INDEPENDENT_AMBULATORY_CARE_PROVIDER_SITE_OTHER): Payer: 59 | Admitting: Gynecology

## 2012-06-13 DIAGNOSIS — N920 Excessive and frequent menstruation with regular cycle: Secondary | ICD-10-CM

## 2012-06-13 LAB — CBC WITH DIFFERENTIAL/PLATELET
Eosinophils Absolute: 0.1 10*3/uL (ref 0.0–0.7)
Hemoglobin: 12.5 g/dL (ref 12.0–15.0)
Lymphocytes Relative: 27 % (ref 12–46)
Lymphs Abs: 3.4 10*3/uL (ref 0.7–4.0)
Monocytes Relative: 8 % (ref 3–12)
Neutro Abs: 7.8 10*3/uL — ABNORMAL HIGH (ref 1.7–7.7)
Neutrophils Relative %: 64 % (ref 43–77)
Platelets: 412 10*3/uL — ABNORMAL HIGH (ref 150–400)
RBC: 4.36 MIL/uL (ref 3.87–5.11)
WBC: 12.3 10*3/uL — ABNORMAL HIGH (ref 4.0–10.5)

## 2012-06-13 MED ORDER — IBUPROFEN 800 MG PO TABS: 800.0000 mg | ORAL_TABLET | Freq: Three times a day (TID) | ORAL | Status: DC | PRN

## 2012-06-13 NOTE — Patient Instructions (Signed)
Follow up for ultrasound as scheduled 

## 2012-06-13 NOTE — Progress Notes (Signed)
Patient presents with LMP November 7. She took Clomid 50 mg daily days 5 through 9 began bleeding day 31 with heavy passage of clots lasted 24 hours and now has tapered off. Also a lot of cramping.  Exam was Sherrilyn Rist Asst. Abdomen soft minimal tenderness without masses guarding rebound organomegaly. Pelvic external BUS vagina light menses flow. Cervix closed light menses flow. Uterus grossly normal midline mobile nontender. Adnexa without masses or tenderness  Assessment and plan: Menorrhagia, Clomid cycle.  Discussed with patient may be hormonal secondary to her first ovulatory cycle. Recommend ultrasound rule out ovarian cystic process intracavitary abnormalities. Baseline CBC and hCG to rule out pregnancy related event. Asked her to do this for the next day or 2. If all normal, options to start Clomid days 5 through 9 again this cycle or rest the cycle was reviewed and we will discuss pending lab and ultrasound results.

## 2012-06-14 LAB — HCG, SERUM, QUALITATIVE: Preg, Serum: NEGATIVE

## 2012-06-15 ENCOUNTER — Ambulatory Visit (INDEPENDENT_AMBULATORY_CARE_PROVIDER_SITE_OTHER): Payer: 59

## 2012-06-15 ENCOUNTER — Ambulatory Visit (INDEPENDENT_AMBULATORY_CARE_PROVIDER_SITE_OTHER): Payer: 59 | Admitting: Gynecology

## 2012-06-15 ENCOUNTER — Encounter: Payer: Self-pay | Admitting: Gynecology

## 2012-06-15 DIAGNOSIS — N949 Unspecified condition associated with female genital organs and menstrual cycle: Secondary | ICD-10-CM

## 2012-06-15 DIAGNOSIS — N921 Excessive and frequent menstruation with irregular cycle: Secondary | ICD-10-CM

## 2012-06-15 DIAGNOSIS — N920 Excessive and frequent menstruation with regular cycle: Secondary | ICD-10-CM

## 2012-06-15 DIAGNOSIS — E282 Polycystic ovarian syndrome: Secondary | ICD-10-CM

## 2012-06-15 DIAGNOSIS — R102 Pelvic and perineal pain: Secondary | ICD-10-CM

## 2012-06-15 NOTE — Progress Notes (Signed)
Patient presents for ultrasound. Was on first Clomid and had heavy bleeding with cramping. Her hemoglobin was 12 hCG negative. Ultrasound today shows a normal appearing uterus with endometrial echo 4.3 mm. Right left ovaries visualized with numerous small follicles consistent with PCO pattern no significant cysts. Cul-de-sac negative.  Patient wants to rest of the cycle and plans that we try Clomid 50 mg next cycle. She will call me when her next menses begins or by week 5.

## 2012-06-15 NOTE — Patient Instructions (Signed)
Call me when your next period starts or if you do not start it. Call me 5 weeks into the cycle.

## 2012-08-20 ENCOUNTER — Other Ambulatory Visit: Payer: Self-pay

## 2012-09-20 ENCOUNTER — Encounter: Payer: Self-pay | Admitting: Gynecology

## 2012-11-21 ENCOUNTER — Ambulatory Visit (INDEPENDENT_AMBULATORY_CARE_PROVIDER_SITE_OTHER): Payer: Managed Care, Other (non HMO) | Admitting: Internal Medicine

## 2012-11-21 VITALS — BP 112/64 | HR 78 | Temp 98.3°F | Resp 18 | Ht 67.5 in | Wt 211.0 lb

## 2012-11-21 DIAGNOSIS — R509 Fever, unspecified: Secondary | ICD-10-CM

## 2012-11-21 DIAGNOSIS — J039 Acute tonsillitis, unspecified: Secondary | ICD-10-CM

## 2012-11-21 DIAGNOSIS — R52 Pain, unspecified: Secondary | ICD-10-CM

## 2012-11-21 LAB — POCT CBC
Granulocyte percent: 67.1 %G (ref 37–80)
HCT, POC: 40.5 % (ref 37.7–47.9)
MCH, POC: 28.5 pg (ref 27–31.2)
MCV: 90.9 fL (ref 80–97)
MID (cbc): 0.6 (ref 0–0.9)
POC LYMPH PERCENT: 24.7 %L (ref 10–50)
Platelet Count, POC: 343 10*3/uL (ref 142–424)
RDW, POC: 13.4 %
WBC: 7.3 10*3/uL (ref 4.6–10.2)

## 2012-11-21 LAB — POCT UA - MICROSCOPIC ONLY
Bacteria, U Microscopic: NEGATIVE
Mucus, UA: NEGATIVE
WBC, Ur, HPF, POC: NEGATIVE

## 2012-11-21 LAB — POCT URINALYSIS DIPSTICK
Bilirubin, UA: NEGATIVE
Ketones, UA: NEGATIVE
Leukocytes, UA: NEGATIVE
Spec Grav, UA: 1.015
pH, UA: 7

## 2012-11-21 MED ORDER — HYDROCODONE-HOMATROPINE 5-1.5 MG/5ML PO SYRP: 5.0000 mL | ORAL_SOLUTION | Freq: Four times a day (QID) | ORAL | Status: DC | PRN

## 2012-11-21 NOTE — Progress Notes (Signed)
Subjective:    Patient ID: Yolanda Gutierrez, female    DOB: January 09, 1986, 27 y.o.   MRN: [id number]  HPI complaining of aching all over for 3 days In bones and joints as well as the big muscles Having fever with chills and night sweats Complaining of sore throat and cough which is occasionally productive  has a headache when the fever and elevated/feels woozy but no syncope or vertigo Complaining of urinary frequency but no dysuria and and no vaginal discharge   Just married in May  the third //last menstrual period May 3/using no birth control See hx by cna as well   Review of Systems  no recent weight loss and  No chest pain or palpitations No nausea vomiting or diarrhea No skin rash No peripheral edema No visual disturbances No neck stiffness    Objective:   Physical Exam BP 112/64  Pulse 78  Temp(Src) 98.3 F (36.8 C) (Oral)  Resp 18  Ht 5' 7.5" (1.715 m)  Wt 211 lb (95.709 kg)  BMI 32.54 kg/m2  SpO2 99%  LMP 10/22/2012 TMs clear Nares clear Throat slightly injected with slight exudate   no a.c. Nodes Chest clear to auscultation Abdomen soft nontender nondistended with no organomegaly or masses Extremities without rash or edema  Results for orders placed in visit on 11/21/12  POCT CBC      Result Value Range   WBC 7.3  4.6 - 10.2 K/uL   Lymph, poc 1.8  0.6 - 3.4   POC LYMPH PERCENT 24.7  10 - 50 %L   MID (cbc) 0.6  0 - 0.9   POC MID % 8.2  0 - 12 %M   POC Granulocyte 4.9  2 - 6.9   Granulocyte percent 67.1  37 - 80 %G   RBC 4.46  4.04 - 5.48 M/uL   Hemoglobin 12.7  12.2 - 16.2 g/dL   HCT, POC 95.6  21.3 - 47.9 %   MCV 90.9  80 - 97 fL   MCH, POC 28.5  27 - 31.2 pg   MCHC 31.4 (*) 31.8 - 35.4 g/dL   RDW, POC 08.6     Platelet Count, POC 343  142 - 424 K/uL   MPV 8.0  0 - 99.8 fL  POCT RAPID STREP A (OFFICE)      Result Value Range   Rapid Strep A Screen Negative  Negative  POCT URINALYSIS DIPSTICK      Result Value Range   Color, UA yellow     Clarity, UA clear     Glucose, UA negative     Bilirubin, UA negative     Ketones, UA negative     Spec Grav, UA 1.015     Blood, UA trace-intact     pH, UA 7.0     Protein, UA negative     Urobilinogen, UA 0.2     Nitrite, UA negative     Leukocytes, UA Negative    POCT UA - MICROSCOPIC ONLY      Result Value Range   WBC, Ur, HPF, POC negative     RBC, urine, microscopic 0-1     Bacteria, U Microscopic negative     Mucus, UA negative     Epithelial cells, urine per micros 1-2     Crystals, Ur, HPF, POC negative     Casts, Ur, LPF, POC negative     Yeast, UA negative          Assessment &  Plan:  Body aches - Plan: POCT CBC, POCT rapid strep A, POCT urinalysis dipstick, POCT UA - Microscopic Only, Culture, Group A Strep  Fever, unspecified - Plan: POCT CBC, POCT rapid strep A, POCT urinalysis dipstick, POCT UA - Microscopic Only, Culture, Group A Strep  Acute tonsillitis - Plan: POCT CBC, POCT rapid strep A, POCT urinalysis dipstick, POCT UA - Microscopic Only, Culture, Group A Strep  Cough  I suspect a viral illness with his flulike syndrome/fluids bed rest and analgesics are needed with close followup/OOW 48 hour

## 2012-11-21 NOTE — Progress Notes (Signed)
  Subjective:    Patient ID: Yolanda Gutierrez, female    DOB: 02-14-1986, 27 y.o.   MRN: [id number]  HPI 27 year old female presents with : generalized bodyaches x 3 days chills, sore throat- hurts to swallow, no fever presently, occasional cough- productive, cramping in abdominal, no vaginal discharge, urinary frequency, SOB x yesterday, no diarrhea, no rash  LMP: 3 weeks ago, no BC Recently married 3 weeks ago  Panic attack last night, possibly due to being so sick  history of panic attacks: occur occasionally 3 times a year No medication treatment for panic attacks recently; in high school she took medicine Review of Systems     Objective:   Physical Exam No leg swelling Pus in throat       Assessment & Plan:

## 2012-11-23 ENCOUNTER — Emergency Department (HOSPITAL_BASED_OUTPATIENT_CLINIC_OR_DEPARTMENT_OTHER)
Admission: EM | Admit: 2012-11-23 | Discharge: 2012-11-23 | Disposition: A | Discharge status: Home / Self Care | Payer: Managed Care, Other (non HMO) | Attending: Emergency Medicine | Admitting: Emergency Medicine

## 2012-11-23 ENCOUNTER — Encounter (HOSPITAL_BASED_OUTPATIENT_CLINIC_OR_DEPARTMENT_OTHER): Payer: Self-pay | Admitting: Emergency Medicine

## 2012-11-23 DIAGNOSIS — T50905A Adverse effect of unspecified drugs, medicaments and biological substances, initial encounter: Secondary | ICD-10-CM

## 2012-11-23 DIAGNOSIS — R55 Syncope and collapse: Secondary | ICD-10-CM | POA: Insufficient documentation

## 2012-11-23 DIAGNOSIS — Z8659 Personal history of other mental and behavioral disorders: Secondary | ICD-10-CM | POA: Insufficient documentation

## 2012-11-23 DIAGNOSIS — T4275XA Adverse effect of unspecified antiepileptic and sedative-hypnotic drugs, initial encounter: Secondary | ICD-10-CM | POA: Insufficient documentation

## 2012-11-23 DIAGNOSIS — H9209 Otalgia, unspecified ear: Secondary | ICD-10-CM | POA: Insufficient documentation

## 2012-11-23 DIAGNOSIS — Z3202 Encounter for pregnancy test, result negative: Secondary | ICD-10-CM | POA: Insufficient documentation

## 2012-11-23 DIAGNOSIS — B9789 Other viral agents as the cause of diseases classified elsewhere: Secondary | ICD-10-CM | POA: Insufficient documentation

## 2012-11-23 DIAGNOSIS — J3489 Other specified disorders of nose and nasal sinuses: Secondary | ICD-10-CM | POA: Insufficient documentation

## 2012-11-23 DIAGNOSIS — IMO0001 Reserved for inherently not codable concepts without codable children: Secondary | ICD-10-CM | POA: Insufficient documentation

## 2012-11-23 DIAGNOSIS — T4271XA Poisoning by unspecified antiepileptic and sedative-hypnotic drugs, accidental (unintentional), initial encounter: Secondary | ICD-10-CM | POA: Insufficient documentation

## 2012-11-23 DIAGNOSIS — Z8619 Personal history of other infectious and parasitic diseases: Secondary | ICD-10-CM | POA: Insufficient documentation

## 2012-11-23 DIAGNOSIS — B349 Viral infection, unspecified: Secondary | ICD-10-CM

## 2012-11-23 DIAGNOSIS — R42 Dizziness and giddiness: Secondary | ICD-10-CM | POA: Insufficient documentation

## 2012-11-23 DIAGNOSIS — J029 Acute pharyngitis, unspecified: Secondary | ICD-10-CM | POA: Insufficient documentation

## 2012-11-23 LAB — CBC WITH DIFFERENTIAL/PLATELET
Basophils Relative: 0 % (ref 0–1)
Eosinophils Absolute: 0.3 10*3/uL (ref 0.0–0.7)
Eosinophils Relative: 5 % (ref 0–5)
Lymphs Abs: 2.4 10*3/uL (ref 0.7–4.0)
MCH: 29.2 pg (ref 26.0–34.0)
MCHC: 34.6 g/dL (ref 30.0–36.0)
MCV: 84.2 fL (ref 78.0–100.0)
Platelets: 324 10*3/uL (ref 150–400)
RBC: 4.56 MIL/uL (ref 3.87–5.11)
RDW: 12 % (ref 11.5–15.5)

## 2012-11-23 LAB — URINALYSIS, ROUTINE W REFLEX MICROSCOPIC
Glucose, UA: NEGATIVE mg/dL
Nitrite: NEGATIVE
Specific Gravity, Urine: 1.017 (ref 1.005–1.030)
pH: 6 (ref 5.0–8.0)

## 2012-11-23 LAB — BASIC METABOLIC PANEL
Calcium: 9.4 mg/dL (ref 8.4–10.5)
GFR calc non Af Amer: >90 mL/min (ref >90)
Glucose, Bld: 194 mg/dL — ABNORMAL HIGH (ref 70–99)
Sodium: 138 mEq/L (ref 135–145)

## 2012-11-23 LAB — PREGNANCY, URINE: Preg Test, Ur: NEGATIVE

## 2012-11-23 LAB — URINE MICROSCOPIC-ADD ON

## 2012-11-23 MED ORDER — ONDANSETRON 8 MG PO TBDP
8.0000 mg | ORAL_TABLET | Freq: Once | ORAL | Status: AC
  Administered 2012-11-23: 8 mg via ORAL
  Filled 2012-11-23: qty 1

## 2012-11-23 MED ORDER — GUAIFENESIN ER 600 MG PO TB12: 600.0000 mg | ORAL_TABLET | Freq: Two times a day (BID) | ORAL | Status: DC

## 2012-11-23 MED ORDER — IBUPROFEN 800 MG PO TABS
800.0000 mg | ORAL_TABLET | Freq: Once | ORAL | Status: AC
  Administered 2012-11-23: 800 mg via ORAL
  Filled 2012-11-23: qty 1

## 2012-11-23 MED ORDER — FLUTICASONE PROPIONATE 50 MCG/ACT NA SUSP: 2.0000 | Freq: Every day | NASAL | Status: DC

## 2012-11-23 MED ORDER — IBUPROFEN 800 MG PO TABS: 800.0000 mg | ORAL_TABLET | Freq: Three times a day (TID) | ORAL | Status: DC

## 2012-11-23 NOTE — ED Provider Notes (Signed)
History     CSN: [id number]  Arrival date & time 11/23/12  0041   First MD Initiated Contact with Patient 11/23/12 0150      Chief Complaint  Patient presents with  . Loss of Consciousness  . Generalized Body Aches  . Dizziness    (Consider location/radiation/quality/duration/timing/severity/associated sxs/prior treatment) Patient is a 27 y.o. female presenting with syncope. The history is provided by the patient. No language interpreter was used.  Loss of Consciousness Episode history:  Single Most recent episode:  Today Timing:  Constant Progression:  Resolved Chronicity:  New Context comment:  Narcotic cough syrup Witnessed: yes   Relieved by:  Nothing Worsened by:  Nothing tried Ineffective treatments:  None tried Associated symptoms: no chest pain, no difficulty breathing, no fever, no nausea, no shortness of breath and no vomiting   Risk factors: no congenital heart disease   Has had myalgias, rhinorrhea ear pain sore throat and nasal congestion for 4 days and was seen at urgent care and had negative strep screen but given hycodan syrup for pain and has felt light headed with same and then passed out for seconds following taking same. No foreign nor domestic travel no tick exposure  Past Medical History  Diagnosis Date  . ASCUS (atypical squamous cells of undetermined significance) on Pap smear 09/2009  . 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 LGSIL (low grade squamous intraepithelial dysplasia) 10/2009  . Allergy   . Anxiety     Past Surgical History  Procedure Laterality Date  . Anterior cruciate ligament repair  2007    Family History  Problem Relation Age of Onset  . Diabetes Father   . Hypertension Father   . Hyperlipidemia Father   . Kidney failure Father   . Colon cancer Maternal Grandfather   . Heart attack Neg Hx   . Sudden death Neg Hx     History  Substance Use Topics  . Smoking status: Never Smoker   . Smokeless tobacco: Never Used  . Alcohol  Use: 0.6 oz/week    1 Cans of beer per week    OB History   Grav Para Term Preterm Abortions TAB SAB Ect Mult Living   0               Review of Systems  Constitutional: Negative for fever.  HENT: Positive for ear pain, congestion, sore throat and rhinorrhea. Negative for drooling, trouble swallowing, neck pain, neck stiffness and voice change.   Respiratory: Negative for shortness of breath.   Cardiovascular: Positive for syncope. Negative for chest pain.  Gastrointestinal: Negative for nausea and vomiting.  Musculoskeletal: Positive for myalgias.  Skin: Negative for rash.  All other systems reviewed and are negative.    Allergies  Acyclovir and related; Nickel; and Tomato  Home Medications   Current Outpatient Rx  Name  Route  Sig  Dispense  Refill  . clomiPHENE (CLOMID) 50 MG tablet   Oral   Take 1 tablet (50 mg total) by mouth daily. Start date 5 through day 9 of your menstrual cycle   5 tablet   0   . HYDROcodone-homatropine (HYCODAN) 5-1.5 MG/5ML syrup   Oral   Take 5 mLs by mouth every 6 (six) hours as needed for cough.   120 mL   0   . ibuprofen (ADVIL,MOTRIN) 800 MG tablet   Oral   Take 1 tablet (800 mg total) by mouth every 8 (eight) hours as needed for pain.   30 tablet  1   . medroxyPROGESTERone (PROVERA) 10 MG tablet   Oral   Take 1 tablet (10 mg total) by mouth 2 (two) times daily.   10 tablet   0   . naproxen sodium (ALEVE) 220 MG tablet      Take 2 tablets twice daily as needed for foot pain and swelling.           BP 116/64  Pulse 72  Temp(Src) 98.8 F (37.1 C) (Oral)  Resp 20  Ht 5\' 8"  (1.727 m)  Wt 213 lb (96.616 kg)  BMI 32.39 kg/m2  SpO2 99%  LMP 10/22/2012  Physical Exam  Constitutional: She is oriented to person, place, and time. She appears well-developed and well-nourished. No distress.  HENT:  Head: Normocephalic and atraumatic. Head is without raccoon's eyes and without Battle's sign.  Right Ear: Tympanic membrane  is not injected. No hemotympanum.  Left Ear: Tympanic membrane is not injected. No hemotympanum.  Mouth/Throat: Oropharynx is clear and moist. No edematous. No oropharyngeal exudate.  Eyes: EOM are normal. Pupils are equal, round, and reactive to light.  Neck: Normal range of motion. Neck supple.  Cardiovascular: Normal rate, regular rhythm and intact distal pulses.   Pulmonary/Chest: Effort normal and breath sounds normal. No stridor. No respiratory distress. She has no wheezes. She has no rales. She exhibits no tenderness.  Abdominal: Soft. Bowel sounds are normal. There is no tenderness. There is no rebound and no guarding.  Musculoskeletal: Normal range of motion. She exhibits no edema and no tenderness.  Lymphadenopathy:    She has no cervical adenopathy.  Neurological: She is alert and oriented to person, place, and time. She has normal reflexes.  Skin: Skin is warm and dry. No rash noted.  Psychiatric: She has a normal mood and affect.    ED Course  Procedures (including critical care time)  Labs Reviewed  URINALYSIS, ROUTINE W REFLEX MICROSCOPIC - Abnormal; Notable for the following:    Hgb urine dipstick TRACE (*)    Leukocytes, UA SMALL (*)    All other components within normal limits  CBC WITH DIFFERENTIAL - Abnormal; Notable for the following:    Monocytes Relative 13 (*)    All other components within normal limits  PREGNANCY, URINE  URINE MICROSCOPIC-ADD ON  MONONUCLEOSIS SCREEN  BASIC METABOLIC PANEL   No results found.   No diagnosis found.    MDM  Viral syndrome with medication effect.  Return for fever stiff neck rashes on the skin changes in speech chest pain shortness of breath or any concerns        Camille Thau Smitty Cords, MD 11/23/12 [phone]

## 2012-11-23 NOTE — ED Notes (Signed)
Pt appears to be very drowsy as a result of narcotic medication.

## 2012-11-23 NOTE — ED Notes (Signed)
Pt has had sore throat and body aches for few days.  Seen in UC yesterday and given Hydrocodone cough syrup.  Sx worse this evening.  Husband states that as he was trying to walk her to the bedroom she "passed out" for about 15 seconds.

## 2012-11-23 NOTE — ED Notes (Signed)
MD at bedside. 

## 2013-02-02 ENCOUNTER — Encounter: Payer: Self-pay | Admitting: Gynecology

## 2013-02-02 ENCOUNTER — Ambulatory Visit: Payer: Managed Care, Other (non HMO) | Admitting: Women's Health

## 2013-02-02 ENCOUNTER — Ambulatory Visit (INDEPENDENT_AMBULATORY_CARE_PROVIDER_SITE_OTHER): Payer: Managed Care, Other (non HMO) | Admitting: Gynecology

## 2013-02-02 DIAGNOSIS — N926 Irregular menstruation, unspecified: Secondary | ICD-10-CM

## 2013-02-02 LAB — CBC WITH DIFFERENTIAL/PLATELET
Basophils Relative: 0 % (ref 0–1)
Eosinophils Absolute: 0.1 10*3/uL (ref 0.0–0.7)
Lymphs Abs: 2.6 10*3/uL (ref 0.7–4.0)
MCH: 28.4 pg (ref 26.0–34.0)
MCHC: 33.8 g/dL (ref 30.0–36.0)
Neutrophils Relative %: 66 % (ref 43–77)
Platelets: 394 10*3/uL (ref 150–400)
RBC: 4.47 MIL/uL (ref 3.87–5.11)

## 2013-02-02 LAB — HCG, SERUM, QUALITATIVE: Preg, Serum: NEGATIVE

## 2013-02-02 MED ORDER — MEDROXYPROGESTERONE ACETATE 10 MG PO TABS: 10.0000 mg | ORAL_TABLET | Freq: Every day | ORAL | Status: DC

## 2013-02-02 NOTE — Progress Notes (Signed)
Patient presents complaining of irregular bleeding since April. She does have a history consistent with PCOS with irregular menses in the past. She reports since the beginning of the year though that her menses have been fairly regular and monthly until April when she started irregular menses and then bled on and off since then. Episodes of heavier bleeding with spotting in between. No hair changes weight changes skin changes or other symptoms. Has been evaluated in the past to include thyroid. Ultrasound in December showed thin endometrial echo and changes consistent with PCOS on the ovaries.  Exam was Berenice Bouton Abdomen soft without masses or tenderness. Pelvic external BUS vagina with dark scant old blood. Cervix normal. Uterus grossly normal in size midline mobile nontender. Adnexa without masses or tenderness.  Assessment and plan: Irregular bleeding, check CBC and beta hCG. Provera 10 mg x10 days withdrawal. Followup if irregular bleeding continues. We had discussed and used on a limited basis Clomid. I again discussed with her she is interested in pursuing pregnancy that this would probably be her best choice. We have discussed the risks of hyperstimulation, multiple gestation and possible ovarian cancer linkage.

## 2013-02-02 NOTE — Patient Instructions (Signed)
After making sure the pregnancy test is negative, start Provera 1 pill daily for 10 days. Keep menstrual calendar afterwards. If irregular bleeding continues then call me. If you go more than 6 weeks without a period call me.

## 2013-02-23 ENCOUNTER — Encounter: Payer: Self-pay | Admitting: Gynecology

## 2013-02-24 ENCOUNTER — Telehealth: Payer: Self-pay | Admitting: *Deleted

## 2013-02-24 MED ORDER — CLOMIPHENE CITRATE 50 MG PO TABS: ORAL_TABLET | ORAL | Status: AC

## 2013-02-24 NOTE — Telephone Encounter (Signed)
Left message on voicemail rx sent to [facility] on [address]

## 2013-02-24 NOTE — Telephone Encounter (Signed)
Message copied by Aura Camps on Fri Feb 24, 2013  9:04 AM ------      Message from: Dara Lords      Created: Thu Feb 23, 2013  5:04 PM       Call in Clomid 50 mg #5, one by mouth day 5 through 9, no refill ------

## 2013-03-29 ENCOUNTER — Encounter: Payer: Self-pay | Admitting: Gynecology

## 2013-04-14 ENCOUNTER — Ambulatory Visit (INDEPENDENT_AMBULATORY_CARE_PROVIDER_SITE_OTHER): Payer: Managed Care, Other (non HMO) | Admitting: Gynecology

## 2013-04-14 ENCOUNTER — Encounter: Payer: Self-pay | Admitting: Gynecology

## 2013-04-14 DIAGNOSIS — N949 Unspecified condition associated with female genital organs and menstrual cycle: Secondary | ICD-10-CM

## 2013-04-14 DIAGNOSIS — R102 Pelvic and perineal pain: Secondary | ICD-10-CM

## 2013-04-14 LAB — CBC WITH DIFFERENTIAL/PLATELET
Basophils Absolute: 0 10*3/uL (ref 0.0–0.1)
Basophils Relative: 0 % (ref 0–1)
MCHC: 34.2 g/dL (ref 30.0–36.0)
Neutro Abs: 11.7 10*3/uL — ABNORMAL HIGH (ref 1.7–7.7)
Neutrophils Relative %: 74 % (ref 43–77)
RDW: 13.4 % (ref 11.5–15.5)

## 2013-04-14 LAB — URINALYSIS W MICROSCOPIC + REFLEX CULTURE
Leukocytes, UA: NEGATIVE
Nitrite: NEGATIVE
Protein, ur: NEGATIVE mg/dL

## 2013-04-14 NOTE — Progress Notes (Signed)
Patient presents having been seen in July with history consistent with PCOS.  Was for planned Provera withdrawal. I discussed Clomid and she also decided to go ahead and use this. She did not start though until the end of September. She took Provera, had a withdrawal bleed and took Clomid 50 mg days 5 through 9. She is now approximately day 21 of this cycle. She had 2 days of bleeding 2 days ago and now today had the onset of lower abdominal pain and cramping. No longer bleeding.  Exam with Selena Batten Assistant Abdomen soft mild lower abdominal discomfort no masses guarding rebound organomegaly. Pelvic external BUS vagina normal. Cervix normal without active bleeding. Uterus grossly normal midline mobile. Adnexa without gross masses. Mildly tender bilaterally.  Assessment and plan: Day 21 of Clomid cycle with pelvic pain. Suspect ovulatory discomfort. No acute changes to suggest torsion. To early to be ectopic unless patient was pregnant when she started this cycle. She did have a withdrawal bleed with the Provera before taking the Clomid. Will check baseline CBC and hCG. It is late Friday afternoon and I reviewed with her that if the hCG would returned positive then she would need to go to the emergency room for further evaluation to rule out ectopic and both she and her husband understand this. Options for ultrasound now as baseline also discussed rule out hyperstimulation/torsion but she would need to be sent to the hospital for this. We'll hold at present given the benign nature of her exam and lack of other symptoms.  Have recommended rest of the next 48 hours, no work and reassess. ASAP call precautions/emergency room presentation reviewed to include worsening pain or certainly if her hCG returned positive. Of note she did have a significant pain and bleeding following her prior Clomid cycle. Patient and husband are comfortable with the plan.

## 2013-04-14 NOTE — Patient Instructions (Signed)
Followup if pain or bleeding persists.

## 2013-04-14 NOTE — Addendum Note (Signed)
Addended by: Dayna Barker on: 04/14/2013 04:51 PM   Modules accepted: Orders

## 2013-04-15 LAB — HCG, QUANTITATIVE, PREGNANCY: hCG, Beta Chain, Quant, S: <2 m[IU]/mL

## 2013-04-16 LAB — URINE CULTURE: Colony Count: 30000

## 2013-04-18 ENCOUNTER — Ambulatory Visit (INDEPENDENT_AMBULATORY_CARE_PROVIDER_SITE_OTHER): Payer: Managed Care, Other (non HMO) | Admitting: Gynecology

## 2013-04-18 ENCOUNTER — Encounter: Payer: Self-pay | Admitting: Gynecology

## 2013-04-18 DIAGNOSIS — R102 Pelvic and perineal pain: Secondary | ICD-10-CM

## 2013-04-18 DIAGNOSIS — N949 Unspecified condition associated with female genital organs and menstrual cycle: Secondary | ICD-10-CM

## 2013-04-18 NOTE — Progress Notes (Signed)
Patient follows up from her 04/14/2013 visit. Notes her pain seems a little better but still there. More of a throbbing pressure sensation. Regular bowel movements. Voiding without difficulty. No bleeding. Lab work showed a negative hCG. Stable hemoglobin at 12 and a mildly elevated white count of 15.  Exam Kim Assistant Abdomen soft with minimal tenderness in the lower quadrants. No rebound guarding masses or organomegaly. Pelvic external BUS vagina normal. Cervix normal. Cultures taken. Uterus grossly normal midline mobile without significant tenderness. Adnexa without gross masses or significant tenderness.  Assessment and plan: Pelvic pain short burst of bleeding last week on Clomid cycle. Cervical cultures taken today. Repeat urinalysis if she did have some hematuria on last urinalysis. Schedule baseline ultrasound for ovarian hyperstimulation surveillance. Monitor pain for now.

## 2013-04-18 NOTE — Patient Instructions (Signed)
Follow up for ultrasound as scheduled 

## 2013-04-19 LAB — GC/CHLAMYDIA PROBE AMP: CT Probe RNA: NEGATIVE

## 2013-04-26 ENCOUNTER — Encounter: Payer: Self-pay | Admitting: Gynecology

## 2013-04-26 ENCOUNTER — Ambulatory Visit (INDEPENDENT_AMBULATORY_CARE_PROVIDER_SITE_OTHER): Payer: Managed Care, Other (non HMO) | Admitting: Gynecology

## 2013-04-26 ENCOUNTER — Ambulatory Visit (INDEPENDENT_AMBULATORY_CARE_PROVIDER_SITE_OTHER): Payer: Managed Care, Other (non HMO)

## 2013-04-26 DIAGNOSIS — N831 Corpus luteum cyst of ovary, unspecified side: Secondary | ICD-10-CM

## 2013-04-26 DIAGNOSIS — N949 Unspecified condition associated with female genital organs and menstrual cycle: Secondary | ICD-10-CM

## 2013-04-26 DIAGNOSIS — R102 Pelvic and perineal pain: Secondary | ICD-10-CM

## 2013-04-26 DIAGNOSIS — R188 Other ascites: Secondary | ICD-10-CM

## 2013-04-26 DIAGNOSIS — E282 Polycystic ovarian syndrome: Secondary | ICD-10-CM

## 2013-04-26 MED ORDER — MEDROXYPROGESTERONE ACETATE 10 MG PO TABS: 10.0000 mg | ORAL_TABLET | Freq: Two times a day (BID) | ORAL | Status: DC

## 2013-04-26 NOTE — Patient Instructions (Signed)
Call if you do not started. After taking the Provera. Make sure you verify that the pregnancy test is negative before starting the Provera.

## 2013-04-26 NOTE — Progress Notes (Signed)
Patient presents in followup for ultrasound due to her history of pelvic pain and Clomid cycle. She has not started her period yet.LMP 04/02/2013.  Ultrasound shows uterus normal size and echotexture. Endometrial echo 13.7 mm. Right and left ovaries consistent with PCO changes. Thickwalled cyst noted left ovary 28 x 23 x 22 mm with peripheral flow consistent with a corpus luteal cyst.  Assessment and plan: Pelvic pain, Clomid cycle. Patient notes pain is improving but still present. Will recheck hCG today. Assuming negative then plan Provera withdrawal 10 mg twice a day x5 days. Assuming withdrawal bleed then will monitor over the next month. If patient wants to pursue stimulated cycle then we'll consider aromatase inhibitor with next cycle.

## 2013-04-27 LAB — HCG, SERUM, QUALITATIVE: Preg, Serum: NEGATIVE

## 2013-05-11 ENCOUNTER — Other Ambulatory Visit: Payer: Self-pay

## 2018-10-27 ENCOUNTER — Emergency Department (HOSPITAL_BASED_OUTPATIENT_CLINIC_OR_DEPARTMENT_OTHER)
Admission: EM | Admit: 2018-10-27 | Discharge: 2018-10-27 | Disposition: A | Discharge status: Home / Self Care | Payer: 59 | Attending: Emergency Medicine | Admitting: Emergency Medicine

## 2018-10-27 ENCOUNTER — Encounter (HOSPITAL_BASED_OUTPATIENT_CLINIC_OR_DEPARTMENT_OTHER): Payer: Self-pay | Admitting: Emergency Medicine

## 2018-10-27 ENCOUNTER — Emergency Department (HOSPITAL_BASED_OUTPATIENT_CLINIC_OR_DEPARTMENT_OTHER): Payer: 59

## 2018-10-27 ENCOUNTER — Other Ambulatory Visit: Payer: Self-pay

## 2018-10-27 DIAGNOSIS — X509XXA Other and unspecified overexertion or strenuous movements or postures, initial encounter: Secondary | ICD-10-CM | POA: Diagnosis not present

## 2018-10-27 DIAGNOSIS — S93402A Sprain of unspecified ligament of left ankle, initial encounter: Secondary | ICD-10-CM | POA: Insufficient documentation

## 2018-10-27 DIAGNOSIS — Y929 Unspecified place or not applicable: Secondary | ICD-10-CM | POA: Insufficient documentation

## 2018-10-27 DIAGNOSIS — E119 Type 2 diabetes mellitus without complications: Secondary | ICD-10-CM | POA: Insufficient documentation

## 2018-10-27 DIAGNOSIS — Y9341 Activity, dancing: Secondary | ICD-10-CM | POA: Diagnosis not present

## 2018-10-27 DIAGNOSIS — Y999 Unspecified external cause status: Secondary | ICD-10-CM | POA: Insufficient documentation

## 2018-10-27 DIAGNOSIS — Z79899 Other long term (current) drug therapy: Secondary | ICD-10-CM | POA: Diagnosis not present

## 2018-10-27 DIAGNOSIS — S99912A Unspecified injury of left ankle, initial encounter: Secondary | ICD-10-CM | POA: Diagnosis present

## 2018-10-27 HISTORY — DX: Type 2 diabetes mellitus without complications: E11.9

## 2018-10-27 MED ORDER — IBUPROFEN 800 MG PO TABS: 800.0000 mg | ORAL_TABLET | Freq: Three times a day (TID) | ORAL | 0 refills | Status: AC | PRN

## 2018-10-27 MED ORDER — HYDROCODONE-ACETAMINOPHEN 5-325 MG PO TABS: 2.0000 | ORAL_TABLET | Freq: Four times a day (QID) | ORAL | 0 refills | Status: AC | PRN

## 2018-10-27 MED ORDER — ONDANSETRON 4 MG PO TBDP: 4.0000 mg | ORAL_TABLET | Freq: Four times a day (QID) | ORAL | 0 refills | Status: AC | PRN

## 2018-10-27 MED ORDER — HYDROCODONE-ACETAMINOPHEN 5-325 MG PO TABS
2.0000 | ORAL_TABLET | Freq: Once | ORAL | Status: AC
  Administered 2018-10-27: 2 via ORAL

## 2018-10-27 MED ORDER — HYDROCODONE-ACETAMINOPHEN 5-325 MG PO TABS
ORAL_TABLET | ORAL | Status: AC
  Administered 2018-10-27: 2 via ORAL
  Filled 2018-10-27: qty 2

## 2018-10-27 MED ORDER — ONDANSETRON 4 MG PO TBDP
4.0000 mg | ORAL_TABLET | Freq: Once | ORAL | Status: AC
  Administered 2018-10-27: 4 mg via ORAL

## 2018-10-27 MED ORDER — ONDANSETRON 4 MG PO TBDP
ORAL_TABLET | ORAL | Status: AC
  Administered 2018-10-27: 4 mg via ORAL
  Filled 2018-10-27: qty 1

## 2018-10-27 NOTE — ED Triage Notes (Signed)
Pt has had ice on injury and took 800 mg Ibuprofen PTA. Pt has coban wrapped around ankle from home.

## 2018-10-27 NOTE — ED Triage Notes (Signed)
Pt c/o right ankle pain after she rolled her foot over while dancing. Occurred just PTA.

## 2018-10-27 NOTE — ED Notes (Signed)
Patient transported to X-ray 

## 2018-10-27 NOTE — Discharge Instructions (Addendum)
You may bear weight as you feel comfortable.  You may use crutches as needed.  Please keep your foot elevated and apply ice when at rest.

## 2018-10-27 NOTE — ED Provider Notes (Signed)
TIME SEEN: 12:36 AM  CHIEF COMPLAINT: Left ankle and foot pain  HPI: Patient is a 33 year old female with history of diabetes who presents to the emergency department with left ankle and foot pain.  States just prior to arrival she was dancing with her niece when she rolled her left ankle and fell to the ground.  Did not strike her head or lose consciousness.  Unable to ambulate secondary to pain.  Her sister is a Engineer, civil (consulting) and wrapped her foot with Coban, gave her 800 of ibuprofen and put ice on her foot.   ROS: See HPI Constitutional: no fever  Eyes: no drainage  ENT: no runny nose   Cardiovascular:  no chest pain  Resp: no SOB  GI: no vomiting GU: no dysuria Integumentary: no rash  Allergy: no hives  Musculoskeletal: no leg swelling  Neurological: no slurred speech ROS otherwise negative  PAST MEDICAL HISTORY/PAST SURGICAL HISTORY:  Past Medical History:  Diagnosis Date  . Allergy   . Anxiety   . ASCUS (atypical squamous cells of undetermined significance) on Pap smear 09/2009  . Diabetes mellitus without complication (HCC)   . 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 LGSIL (low grade squamous intraepithelial dysplasia) 10/2009    MEDICATIONS:  Prior to Admission medications   Medication Sig Start Date End Date Taking? Authorizing Provider  clomiPHENE (CLOMID) 50 MG tablet One tablet by mouth day 5 through 9, 02/24/13  Yes Fontaine, Nadyne Coombes, MD    ALLERGIES:  Allergies  Allergen Reactions  . Acyclovir And Related Nausea And Vomiting  . Nickel   . Tomato     SOCIAL HISTORY:  Social History   Tobacco Use  . Smoking status: Never Smoker  . Smokeless tobacco: Never Used  Substance Use Topics  . Alcohol use: Yes    Alcohol/week: 1.0 standard drinks    Types: 1 Cans of beer per week    FAMILY HISTORY: Family History  Problem Relation Age of Onset  . Diabetes Father   . Hypertension Father   . Hyperlipidemia Father   . Kidney failure Father   . Colon cancer Maternal  Grandfather   . Heart attack Neg Hx   . Sudden death Neg Hx     EXAM: BP 125/87 (BP Location: Right Arm)   Pulse 82   Temp 98.2 F (36.8 C) (Oral)   Resp 18   Ht 5\' 8"  (1.727 m)   Wt 88.5 kg   SpO2 98%   BMI 29.65 kg/m  CONSTITUTIONAL: Alert and oriented and responds appropriately to questions. Well-appearing; well-nourished; GCS 15 HEAD: Normocephalic; atraumatic EYES: Conjunctivae clear, PERRL, EOMI ENT: normal nose; no rhinorrhea; moist mucous membranes NECK: Supple, no midline spinal tenderness, step-off or deformity; trachea midline CARD: RRR; S1 and S2 appreciated; no murmurs, no clicks, no rubs, no gallops RESP: Normal chest excursion without splinting or tachypnea; breath sounds clear and equal bilaterally; no wheezes, no rhonchi, no rales; no hypoxia or respiratory distress ABD/GI: Normal bowel sounds; non-distended; soft, non-tender, no rebound, no guarding; no ecchymosis or other lesions noted BACK:  The back appears normal and is non-tender to palpation, no midline spinal tenderness, step-off or deformity EXT: Tender to palpation over the dorsal left foot, lateral left ankle with swelling and ecchymosis.  No tenderness to palpation over the proximal fibular head on the left side.  Compartments in the left lower extremity are soft.  2+ left DP pulse.  No joint effusions, erythema or warmth noted.  Otherwise no major  deformities noted to her other extremities. SKIN: Normal color for age and race; warm NEURO: Moves all extremities equally, normal sensation diffusely PSYCH: The patient's mood and manner are appropriate. Grooming and personal hygiene are appropriate.  MEDICAL DECISION MAKING: Patient here with left foot and ankle injury that occurred just prior to arrival.  Neurovascular intact distally.  No compartment syndrome.  Will obtain x-rays of the left foot and ankle.  Given Vicodin and Zofran for symptomatic relief.  Sister drove her to the emergency department.  No  other sign of injury on exam.  ED PROGRESS: X-ray show small avulsed fragment at the tip of the lateral malleolus and distal lateral calcaneus consistent with sprain.  I am unable to test ligamentous laxity given her pain.  Have advised her to follow-up with orthopedics if symptoms not improving in 1 to 2 weeks.  Will place an Ace wrap and provide crutches for comfort.  Discussed with her that she can weight-bear as tolerated.  Will discharge with prescriptions for Vicodin, ibuprofen, Zofran.  Discussed supportive care instructions and return precautions.  She verbalized understanding.  Reports Vicodin is helping her pain significantly.   At this time, I do not feel there is any life-threatening condition present. I have reviewed and discussed all results (EKG, imaging, lab, urine as appropriate) and exam findings with patient/family. I have reviewed nursing notes and appropriate previous records.  I feel the patient is safe to be discharged home without further emergent workup and can continue workup as an outpatient as needed. Discussed usual and customary return precautions. Patient/family verbalize understanding and are comfortable with this plan.  Outpatient follow-up has been provided as needed. All questions have been answered.      Shamikia Linskey, Layla MawKristen N, DO 10/27/18 [phone]

## 2019-08-10 IMAGING — DX LEFT ANKLE COMPLETE - 3+ VIEW
3 series · 3 of 3 positions shown · non-contrast
Comparison: None.

CLINICAL DATA: Rolled ankle, lateral pain

EXAM:
LEFT ANKLE COMPLETE - 3+ VIEW

[ankle ap]
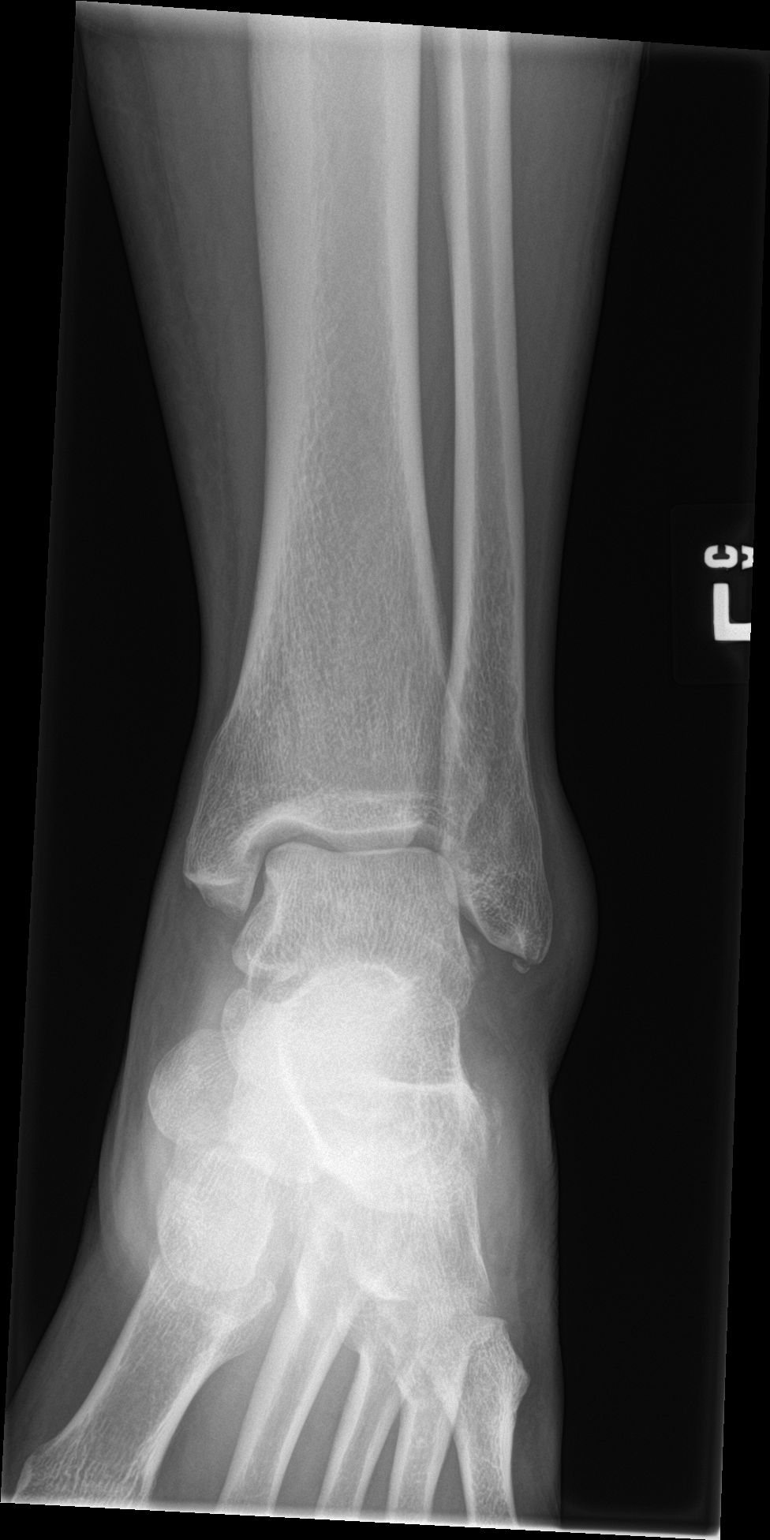

[ankle obl]
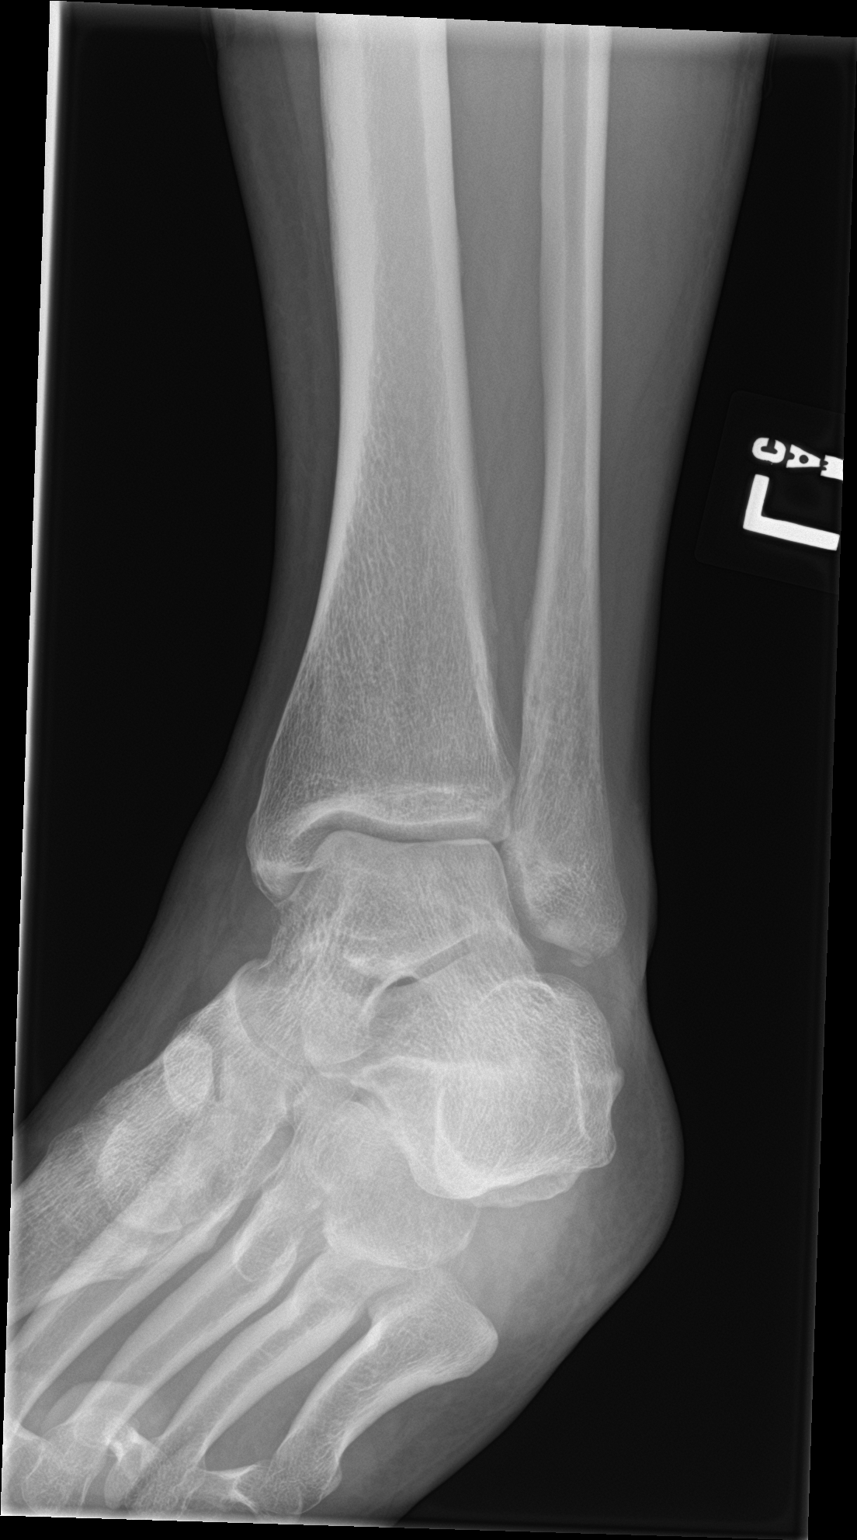

[ankle lat]
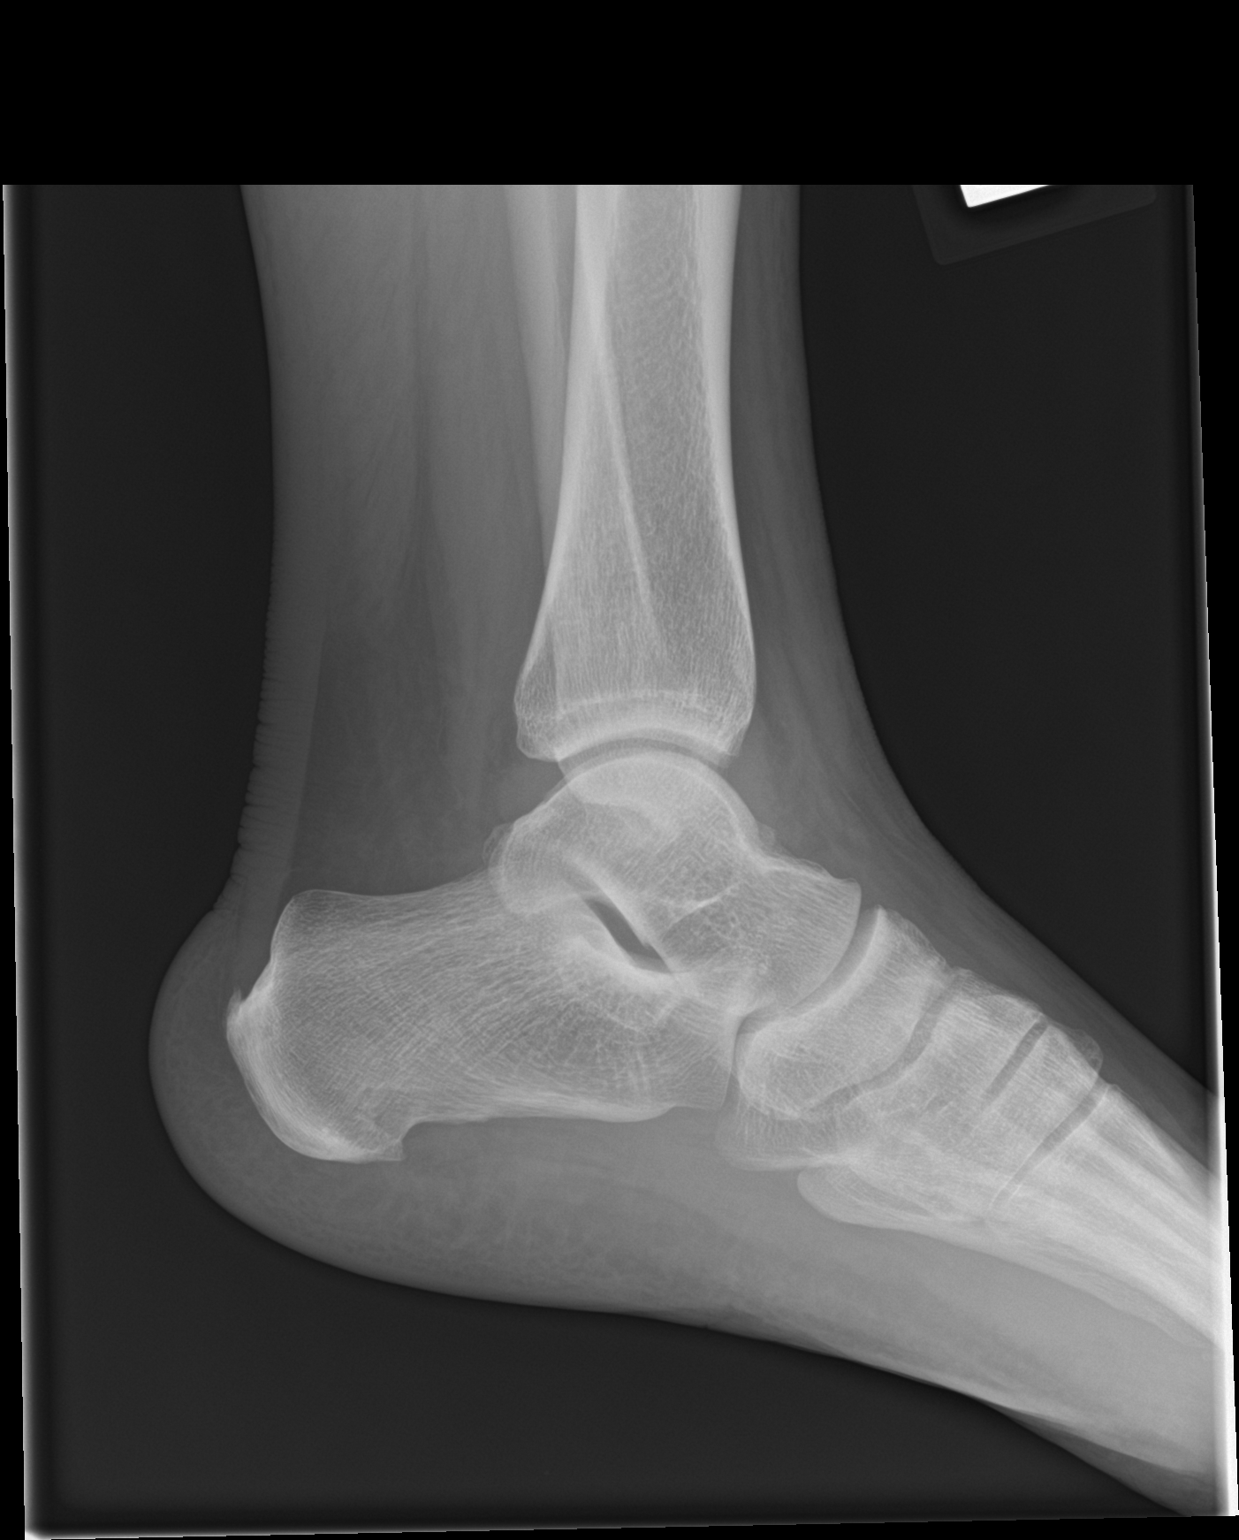

[3 of 3 positions shown; findings below may reference images not displayed]

FINDINGS: There is lateral soft tissue swelling. Small avulsed fragments noted
from the tip of the lateral malleolus and laterally in the hindfoot,
likely from the lateral calcaneus no visible tibial abnormality.
Ankle mortise is intact.
IMPRESSION: Lateral soft tissue swelling. Small avulsed fragment from the tip of
the lateral malleolus and the lateral hindfoot, likely calcaneus.
